# Patient Record
Sex: Female | Born: 1955 | Race: White | Hispanic: No | Marital: Married | Smoking: Never smoker
Health system: Southern US, Community
[De-identification: ages and names within clinical notes are randomized; demographics above are authoritative.]

## PROBLEM LIST (undated history)

## (undated) DIAGNOSIS — C4490 Unspecified malignant neoplasm of skin, unspecified: Secondary | ICD-10-CM

## (undated) DIAGNOSIS — B029 Zoster without complications: Secondary | ICD-10-CM

## (undated) DIAGNOSIS — C4491 Basal cell carcinoma of skin, unspecified: Secondary | ICD-10-CM

## (undated) DIAGNOSIS — E559 Vitamin D deficiency, unspecified: Secondary | ICD-10-CM

## (undated) HISTORY — DX: Vitamin D deficiency, unspecified: E55.9

## (undated) HISTORY — DX: Zoster without complications: B02.9

## (undated) HISTORY — DX: Basal cell carcinoma of skin, unspecified: C44.91

## (undated) HISTORY — PX: TONSILLECTOMY: SUR1361

## (undated) HISTORY — DX: Unspecified malignant neoplasm of skin, unspecified: C44.90

## (undated) HISTORY — PX: ABDOMINAL HYSTERECTOMY: SHX81

---

## 2015-10-23 DIAGNOSIS — C4492 Squamous cell carcinoma of skin, unspecified: Secondary | ICD-10-CM

## 2015-10-23 HISTORY — DX: Squamous cell carcinoma of skin, unspecified: C44.92

## 2017-10-23 ENCOUNTER — Ambulatory Visit: Payer: Self-pay | Admitting: Unknown Physician Specialty

## 2017-10-25 ENCOUNTER — Ambulatory Visit: Payer: BLUE CROSS/BLUE SHIELD | Admitting: Unknown Physician Specialty

## 2017-10-25 ENCOUNTER — Encounter: Payer: Self-pay | Admitting: Unknown Physician Specialty

## 2017-10-25 VITALS — BP 140/76 | HR 83 | Temp 98.8°F | Ht 67.1 in | Wt 130.5 lb

## 2017-10-25 DIAGNOSIS — B029 Zoster without complications: Secondary | ICD-10-CM | POA: Diagnosis not present

## 2017-10-25 DIAGNOSIS — R05 Cough: Secondary | ICD-10-CM | POA: Diagnosis not present

## 2017-10-25 DIAGNOSIS — Z7689 Persons encountering health services in other specified circumstances: Secondary | ICD-10-CM

## 2017-10-25 DIAGNOSIS — B028 Zoster with other complications: Secondary | ICD-10-CM | POA: Insufficient documentation

## 2017-10-25 DIAGNOSIS — J0100 Acute maxillary sinusitis, unspecified: Secondary | ICD-10-CM

## 2017-10-25 DIAGNOSIS — R059 Cough, unspecified: Secondary | ICD-10-CM

## 2017-10-25 MED ORDER — CEFDINIR 300 MG PO CAPS: 300.0000 mg | ORAL_CAPSULE | Freq: Two times a day (BID) | ORAL | 0 refills | Status: DC

## 2017-10-25 MED ORDER — BENZONATATE 200 MG PO CAPS: 200.0000 mg | ORAL_CAPSULE | Freq: Two times a day (BID) | ORAL | 0 refills | Status: DC | PRN

## 2017-10-25 MED ORDER — VALACYCLOVIR HCL 1 G PO TABS: 1000.0000 mg | ORAL_TABLET | Freq: Every day | ORAL | 3 refills | Status: DC

## 2017-10-25 NOTE — Progress Notes (Signed)
BP 140/76   Pulse 83   Temp 98.8 F (37.1 C) (Oral)   Ht 5' 7.1" (1.704 m)   Wt 130 lb 8 oz (59.2 kg)   SpO2 98%   BMI 20.38 kg/m    Subjective:    Patient ID: Heather Browning, female    DOB: Jan 01, 1956, 62 y.o.   MRN: [id number]  HPI: Heather Browning is a 62 y.o. female  Chief Complaint  Patient presents with  . Establish Care  . URI    pt states she has been feeling bad since 10/03/17. States she has had a cough, sore throat, sinus pressue, headache, and chills at night. States the cough gets worse at night    Just moved from [facility].  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    URI   This is a new (History of sinus infections.  usually requires 2 weeks of Augmentin) problem. Episode onset: 2-3 weeks. The problem has been waxing and waning. Maximum temperature: started with a fever for 3 days. Associated symptoms include congestion, coughing, ear pain, headaches, rhinorrhea and sinus pain. Pertinent negatives include no abdominal pain, chest pain, diarrhea, dysuria, joint pain, joint swelling, nausea, neck pain, plugged ear sensation, rash, sneezing, sore throat, swollen glands, vomiting or wheezing. Treatments tried: Started 5 days of Augmentin 3 weeks of antibiotics.   Past Medical History:  Diagnosis Date  . Shingles    Past Surgical History:  Procedure Laterality Date  . ABDOMINAL HYSTERECTOMY     pt has one ovary left  . TONSILLECTOMY     Social History   Socioeconomic History  . Marital status: Married    Spouse name: Not on file  . Number of children: Not on file  . Years of education: Not on file  . Highest education level: Not on file  Social Needs  . Financial resource strain: Not on file  . Food insecurity - worry: Not on file  . Food insecurity - inability: Not on file  . Transportation needs - medical: Not on file  . Transportation needs - non-medical: Not on file  Occupational History  . Not on file  Tobacco Use  . Smoking status: Never  Smoker  . Smokeless tobacco: Never Used  Substance and Sexual Activity  . Alcohol use: No    Frequency: Never  . Drug use: No  . Sexual activity: Not on file  Other Topics Concern  . Not on file  Social History Narrative  . Not on file   Family History  Problem Relation Age of Onset  . Polycythemia Mother   . Heart disease Father   . Heart disease Maternal Grandmother   . Heart disease Maternal Grandfather   . Heart disease Paternal Grandfather     Relevant past medical, surgical, family and social history reviewed and updated as indicated. Interim medical history since our last visit reviewed. Allergies and medications reviewed and updated.  Review of Systems  HENT: Positive for congestion, ear pain, rhinorrhea and sinus pain. Negative for sneezing and sore throat.   Respiratory: Positive for cough. Negative for wheezing.   Cardiovascular: Negative for chest pain.  Gastrointestinal: Negative for abdominal pain, diarrhea, nausea and vomiting.  Genitourinary: Negative for dysuria.  Musculoskeletal: Negative for joint pain and neck pain.  Skin: Negative for rash.  Neurological: Positive for headaches.    Per HPI unless specifically indicated above     Objective:    BP 140/76   Pulse 83   Temp 98.8  F (37.1 C) (Oral)   Ht 5' 7.1" (1.704 m)   Wt 130 lb 8 oz (59.2 kg)   SpO2 98%   BMI 20.38 kg/m   Wt Readings from Last 3 Encounters:  10/25/17 130 lb 8 oz (59.2 kg)    Physical Exam  Constitutional: She is oriented to person, place, and time. She appears well-developed and well-nourished. No distress.  HENT:  Head: Normocephalic and atraumatic.  Right Ear: Tympanic membrane and ear canal normal.  Left Ear: Tympanic membrane and ear canal normal.  Nose: No rhinorrhea. Right sinus exhibits maxillary sinus tenderness. Right sinus exhibits no frontal sinus tenderness. Left sinus exhibits maxillary sinus tenderness. Left sinus exhibits no frontal sinus tenderness.    Eyes: Conjunctivae and lids are normal. Right eye exhibits no discharge. Left eye exhibits no discharge. No scleral icterus.  Cardiovascular: Normal rate and regular rhythm.  Pulmonary/Chest: Effort normal and breath sounds normal. No respiratory distress.  Abdominal: Normal appearance. There is no splenomegaly or hepatomegaly.  Musculoskeletal: Normal range of motion.  Neurological: She is alert and oriented to person, place, and time.  Skin: Skin is intact. No rash noted. No pallor.  Psychiatric: She has a normal mood and affect. Her behavior is normal. Judgment and thought content normal.    No results found for this or any previous visit.    Assessment & Plan:   Problem List Items Addressed This Visit      Unprioritized   Zoster with other complications   Relevant Medications   cefdinir (OMNICEF) 300 MG capsule   valACYclovir (VALTREX) 1000 MG tablet    Other Visit Diagnoses    Acute non-recurrent maxillary sinusitis    -  Primary   Relevant Medications   cefdinir (OMNICEF) 300 MG capsule   benzonatate (TESSALON) 200 MG capsule   valACYclovir (VALTREX) 1000 MG tablet   Herpes zoster without complication       Relevant Medications   cefdinir (OMNICEF) 300 MG capsule   valACYclovir (VALTREX) 1000 MG tablet   Encounter to establish care       Cough       Tessalon Perles for cough.  Pt ed.      -+   Follow up plan: Return if symptoms worsen or fail to improve, for Fall for phyysical.

## 2017-10-25 NOTE — Patient Instructions (Addendum)
Dr. Marolyn Hammock [facility] in [place]

## 2017-12-13 ENCOUNTER — Ambulatory Visit
Admission: RE | Admit: 2017-12-13 | Discharge: 2017-12-13 | Disposition: A | Discharge status: Home / Self Care | Payer: 59 | Source: Ambulatory Visit | Attending: Unknown Physician Specialty | Admitting: Unknown Physician Specialty

## 2017-12-13 ENCOUNTER — Encounter: Payer: Self-pay | Admitting: Unknown Physician Specialty

## 2017-12-13 ENCOUNTER — Ambulatory Visit: Payer: 59 | Admitting: Unknown Physician Specialty

## 2017-12-13 VITALS — BP 125/77 | HR 68 | Temp 98.3°F | Wt 129.0 lb

## 2017-12-13 DIAGNOSIS — R059 Cough, unspecified: Secondary | ICD-10-CM

## 2017-12-13 DIAGNOSIS — R05 Cough: Secondary | ICD-10-CM

## 2017-12-13 DIAGNOSIS — R5383 Other fatigue: Secondary | ICD-10-CM | POA: Diagnosis not present

## 2017-12-13 DIAGNOSIS — J320 Chronic maxillary sinusitis: Secondary | ICD-10-CM | POA: Diagnosis not present

## 2017-12-13 DIAGNOSIS — K1379 Other lesions of oral mucosa: Secondary | ICD-10-CM

## 2017-12-13 MED ORDER — DOXYCYCLINE HYCLATE 100 MG PO TABS: 100.0000 mg | ORAL_TABLET | Freq: Two times a day (BID) | ORAL | 0 refills | Status: DC

## 2017-12-13 NOTE — Progress Notes (Signed)
BP 125/77   Pulse 68   Temp 98.3 F (36.8 C) (Oral)   Wt 129 lb (58.5 kg)   SpO2 99%   BMI 20.14 kg/m    Subjective:    Patient ID: Heather Browning, female    DOB: [birth year], 62 y.o.   MRN: [id number]  HPI: Heather Browning is a 62 y.o. female  Chief Complaint  Patient presents with  . Cough    pt states she still has a wheezy cough, has had it since January visit. Has also had a almost constant headache since the cough started as well.   . Fatigue    pt states she has been feeling very fatigued and tired since January as well, but worse within the last week    Pt is here with ongoing symptoms for 2 months.  I saw her last month and prescribed Omnicef.  Following this, she thought she was improving, never totally resolved and started getting worse again.    Cough  This is a recurrent problem. The problem has been gradually worsening. The cough is productive of purulent sputum. Associated symptoms include chest pain, ear congestion, ear pain, headaches, nasal congestion, postnasal drip and shortness of breath. Pertinent negatives include no chills, fever, heartburn, hemoptysis, myalgias, rash, rhinorrhea, sore throat, sweats, weight loss or wheezing. The symptoms are aggravated by lying down and cold air (warm liquids and cough drops help). Treatments tried: antibiotics.   Mouth sores are recurrent.  On Valtrex.  Having to increase dose  Relevant past medical, surgical, family and social history reviewed and updated as indicated. Interim medical history since our last visit reviewed. Allergies and medications reviewed and updated.  Review of Systems  Constitutional: Positive for fatigue. Negative for chills, fever and weight loss.       Fatigue is a significant complaint that seems to be getting worse.  Sleeps 8-9 hours at night and napping.    HENT: Positive for ear pain and postnasal drip. Negative for rhinorrhea and sore throat.   Respiratory: Positive for cough and shortness of  breath. Negative for hemoptysis and wheezing.   Cardiovascular: Positive for chest pain.  Gastrointestinal: Negative for heartburn.  Musculoskeletal: Negative for myalgias.  Skin: Negative for rash.  Neurological: Positive for headaches.    Per HPI unless specifically indicated above     Objective:    BP 125/77   Pulse 68   Temp 98.3 F (36.8 C) (Oral)   Wt 129 lb (58.5 kg)   SpO2 99%   BMI 20.14 kg/m   Wt Readings from Last 3 Encounters:  12/13/17 129 lb (58.5 kg)  10/25/17 130 lb 8 oz (59.2 kg)    Physical Exam  Constitutional: She is oriented to person, place, and time. She appears well-developed and well-nourished. No distress.  HENT:  Head: Normocephalic and atraumatic.  Right Ear: Tympanic membrane and ear canal normal.  Left Ear: Tympanic membrane and ear canal normal.  Nose: No rhinorrhea. Right sinus exhibits maxillary sinus tenderness. Right sinus exhibits no frontal sinus tenderness. Left sinus exhibits maxillary sinus tenderness. Left sinus exhibits no frontal sinus tenderness.  Eyes: Conjunctivae and lids are normal. Right eye exhibits no discharge. Left eye exhibits no discharge. No scleral icterus.  Cardiovascular: Normal rate and regular rhythm.  Pulmonary/Chest: Effort normal and breath sounds normal. No respiratory distress.  Abdominal: Normal appearance. There is no splenomegaly or hepatomegaly.  Musculoskeletal: Normal range of motion.  Neurological: She is alert and oriented to person, place, and time.  Skin: Skin is intact. No rash noted. No pallor.  Psychiatric: She has a normal mood and affect. Her behavior is normal. Judgment and thought content normal.    No results found for this or any previous visit.    Assessment & Plan:   Problem List Items Addressed This Visit    None    Visit Diagnoses    Chronic maxillary sinusitis    -  Primary   Refer to ENT due to persistent symptoms despite adequate treatment.  Rx for Doxycycline to r/o atypical    Relevant Medications   doxycycline (VIBRA-TABS) 100 MG tablet   Other Relevant Orders   Ambulatory referral to ENT   Cough       Relevant Orders   DG Chest 2 View   Fatigue, unspecified type       Chec TSH, chest x-ray, CBC, CMP   Relevant Orders   CBC with Differential/Platelet   Comprehensive metabolic panel   VITAMIN D 25 Hydroxy (Vit-D Deficiency, Fractures)   TSH   Mouth sores       Worsening.  Increasing Valtrex   Relevant Orders   CBC with Differential/Platelet   Vitamin B12       Follow up plan: Return in about 1 week (around 12/20/2017).

## 2017-12-14 LAB — COMPREHENSIVE METABOLIC PANEL
A/G RATIO: 2 (ref 1.2–2.2)
ALK PHOS: 61 IU/L (ref 39–117)
ALT: 11 IU/L (ref 0–32)
AST: 16 IU/L (ref 0–40)
Albumin: 4.3 g/dL (ref 3.5–5.5)
BUN/Creatinine Ratio: 16 (ref 9–23)
BUN: 12 mg/dL (ref 6–24)
Bilirubin Total: 0.5 mg/dL (ref 0.0–1.2)
CO2: 23 mmol/L (ref 20–29)
Calcium: 9.4 mg/dL (ref 8.7–10.2)
Chloride: 101 mmol/L (ref 96–106)
Creatinine, Ser: 0.74 mg/dL (ref 0.57–1.00)
GFR calc Af Amer: 103 mL/min/{1.73_m2} (ref >59)
GFR calc non Af Amer: 89 mL/min/{1.73_m2} (ref >59)
GLOBULIN, TOTAL: 2.1 g/dL (ref 1.5–4.5)
Glucose: 69 mg/dL (ref 65–99)
Potassium: 4.2 mmol/L (ref 3.5–5.2)
SODIUM: 142 mmol/L (ref 134–144)
Total Protein: 6.4 g/dL (ref 6.0–8.5)

## 2017-12-14 LAB — CBC WITH DIFFERENTIAL/PLATELET
BASOS: 1 %
Basophils Absolute: 0.1 10*3/uL (ref 0.0–0.2)
EOS (ABSOLUTE): 0.1 10*3/uL (ref 0.0–0.4)
EOS: 2 %
HEMATOCRIT: 41.7 % (ref 34.0–46.6)
Hemoglobin: 13.4 g/dL (ref 11.1–15.9)
Immature Grans (Abs): 0 10*3/uL (ref 0.0–0.1)
Immature Granulocytes: 0 %
Lymphocytes Absolute: 1.5 10*3/uL (ref 0.7–3.1)
Lymphs: 26 %
MCH: 33.2 pg — ABNORMAL HIGH (ref 26.6–33.0)
MCHC: 32.1 g/dL (ref 31.5–35.7)
MCV: 103 fL — AB (ref 79–97)
MONOS ABS: 0.5 10*3/uL (ref 0.1–0.9)
Monocytes: 9 %
Neutrophils Absolute: 3.5 10*3/uL (ref 1.4–7.0)
Neutrophils: 62 %
Platelets: 264 10*3/uL (ref 150–379)
RBC: 4.04 x10E6/uL (ref 3.77–5.28)
RDW: 14 % (ref 12.3–15.4)
WBC: 5.7 10*3/uL (ref 3.4–10.8)

## 2017-12-14 LAB — TSH: TSH: 1.99 u[IU]/mL (ref 0.450–4.500)

## 2017-12-14 LAB — VITAMIN B12: VITAMIN B 12: 385 pg/mL (ref 232–1245)

## 2017-12-14 LAB — VITAMIN D 25 HYDROXY (VIT D DEFICIENCY, FRACTURES): VIT D 25 HYDROXY: 15.8 ng/mL — AB (ref 30.0–100.0)

## 2017-12-24 ENCOUNTER — Ambulatory Visit: Payer: 59 | Admitting: Unknown Physician Specialty

## 2017-12-25 ENCOUNTER — Telehealth: Payer: Self-pay | Admitting: Unknown Physician Specialty

## 2017-12-25 ENCOUNTER — Encounter: Payer: Self-pay | Admitting: Unknown Physician Specialty

## 2017-12-25 DIAGNOSIS — D7589 Other specified diseases of blood and blood-forming organs: Secondary | ICD-10-CM | POA: Insufficient documentation

## 2017-12-25 DIAGNOSIS — E559 Vitamin D deficiency, unspecified: Secondary | ICD-10-CM

## 2017-12-25 HISTORY — DX: Vitamin D deficiency, unspecified: E55.9

## 2017-12-25 MED ORDER — VITAMIN D (ERGOCALCIFEROL) 1.25 MG (50000 UNIT) PO CAPS: 50000.0000 [IU] | ORAL_CAPSULE | ORAL | 0 refills | Status: DC

## 2017-12-25 NOTE — Progress Notes (Signed)
Patient notified of results by phone.

## 2017-12-25 NOTE — Telephone Encounter (Signed)
Discussed labs with pt.  Prescription doses of Vitamin D and oral B12

## 2018-01-06 ENCOUNTER — Ambulatory Visit: Payer: 59 | Admitting: Unknown Physician Specialty

## 2018-01-06 ENCOUNTER — Encounter: Payer: Self-pay | Admitting: Unknown Physician Specialty

## 2018-01-06 ENCOUNTER — Telehealth: Payer: Self-pay | Admitting: Unknown Physician Specialty

## 2018-01-06 VITALS — BP 129/84 | HR 96 | Temp 98.4°F | Ht 67.1 in | Wt 135.6 lb

## 2018-01-06 DIAGNOSIS — R5383 Other fatigue: Secondary | ICD-10-CM | POA: Diagnosis not present

## 2018-01-06 DIAGNOSIS — R053 Chronic cough: Secondary | ICD-10-CM | POA: Insufficient documentation

## 2018-01-06 DIAGNOSIS — R059 Cough, unspecified: Secondary | ICD-10-CM

## 2018-01-06 DIAGNOSIS — R05 Cough: Secondary | ICD-10-CM | POA: Diagnosis not present

## 2018-01-06 DIAGNOSIS — D7589 Other specified diseases of blood and blood-forming organs: Secondary | ICD-10-CM

## 2018-01-06 MED ORDER — FLUTICASONE-SALMETEROL 100-50 MCG/DOSE IN AEPB: 1.0000 | INHALATION_SPRAY | Freq: Two times a day (BID) | RESPIRATORY_TRACT | 3 refills | Status: DC

## 2018-01-06 NOTE — Progress Notes (Signed)
BP 129/84   Pulse 96   Temp 98.4 F (36.9 C) (Oral)   Ht 5' 7.1" (1.704 m)   Wt 135 lb 9.6 oz (61.5 kg)   SpO2 99%   BMI 21.17 kg/m    Subjective:    Patient ID: Heather Browning, female    DOB: 11-Dec-1955, 62 y.o.   MRN: [id number]  HPI: Heather Browning is a 62 y.o. female  Chief Complaint  Patient presents with  . Follow-up    pt states she is not feeling any better after starting vitamin D and B12   Cough Pt is here with complaints of a persistent cough.  She is concerned about family mambers who have fibrocystic lung disease.  Chest x-ray was normal.  Continues to be fatigued.  She does complain of a post nasal drip  Macrocytosis Pt with Macrocytosis without anemia.  No alcohol use.  B12 is low normal  Relevant past medical, surgical, family and social history reviewed and updated as indicated. Interim medical history since our last visit reviewed. Allergies and medications reviewed and updated.  Review of Systems  Per HPI unless specifically indicated above     Objective:    BP 129/84   Pulse 96   Temp 98.4 F (36.9 C) (Oral)   Ht 5' 7.1" (1.704 m)   Wt 135 lb 9.6 oz (61.5 kg)   SpO2 99%   BMI 21.17 kg/m   Wt Readings from Last 3 Encounters:  01/06/18 135 lb 9.6 oz (61.5 kg)  12/13/17 129 lb (58.5 kg)  10/25/17 130 lb 8 oz (59.2 kg)    Physical Exam  Constitutional: She is oriented to person, place, and time. She appears well-developed and well-nourished. No distress.  HENT:  Head: Normocephalic and atraumatic.  Eyes: Conjunctivae and lids are normal. Right eye exhibits no discharge. Left eye exhibits no discharge. No scleral icterus.  Neck: Normal range of motion. Neck supple. No JVD present. Carotid bruit is not present.  Cardiovascular: Normal rate, regular rhythm and normal heart sounds.  Pulmonary/Chest: Effort normal and breath sounds normal.  Abdominal: Normal appearance. There is no splenomegaly or hepatomegaly.  Musculoskeletal: Normal range of  motion.  Neurological: She is alert and oriented to person, place, and time.  Skin: Skin is warm, dry and intact. No rash noted. No pallor.  Psychiatric: She has a normal mood and affect. Her behavior is normal. Judgment and thought content normal.   Spirometry is normal with advanced lung age.  Cough not much improved.    Results for orders placed or performed in visit on 12/13/17  CBC with Differential/Platelet  Result Value Ref Range   WBC 5.7 3.4 - 10.8 x10E3/uL   RBC 4.04 3.77 - 5.28 x10E6/uL   Hemoglobin 13.4 11.1 - 15.9 g/dL   Hematocrit 41.7 34.0 - 46.6 %   MCV 103 (H) 79 - 97 fL   MCH 33.2 (H) 26.6 - 33.0 pg   MCHC 32.1 31.5 - 35.7 g/dL   RDW 14.0 12.3 - 15.4 %   Platelets 264 150 - 379 x10E3/uL   Neutrophils 62 Not Estab. %   Lymphs 26 Not Estab. %   Monocytes 9 Not Estab. %   Eos 2 Not Estab. %   Basos 1 Not Estab. %   Neutrophils Absolute 3.5 1.4 - 7.0 x10E3/uL   Lymphocytes Absolute 1.5 0.7 - 3.1 x10E3/uL   Monocytes Absolute 0.5 0.1 - 0.9 x10E3/uL   EOS (ABSOLUTE) 0.1 0.0 - 0.4 x10E3/uL  Basophils Absolute 0.1 0.0 - 0.2 x10E3/uL   Immature Granulocytes 0 Not Estab. %   Immature Grans (Abs) 0.0 0.0 - 0.1 x10E3/uL  Comprehensive metabolic panel  Result Value Ref Range   Glucose 69 65 - 99 mg/dL   BUN 12 6 - 24 mg/dL   Creatinine, Ser 0.74 0.57 - 1.00 mg/dL   GFR calc non Af Amer 89 >59 mL/min/1.73   GFR calc Af Amer 103 >59 mL/min/1.73   BUN/Creatinine Ratio 16 9 - 23   Sodium 142 134 - 144 mmol/L   Potassium 4.2 3.5 - 5.2 mmol/L   Chloride 101 96 - 106 mmol/L   CO2 23 20 - 29 mmol/L   Calcium 9.4 8.7 - 10.2 mg/dL   Total Protein 6.4 6.0 - 8.5 g/dL   Albumin 4.3 3.5 - 5.5 g/dL   Globulin, Total 2.1 1.5 - 4.5 g/dL   Albumin/Globulin Ratio 2.0 1.2 - 2.2   Bilirubin Total 0.5 0.0 - 1.2 mg/dL   Alkaline Phosphatase 61 39 - 117 IU/L   AST 16 0 - 40 IU/L   ALT 11 0 - 32 IU/L  VITAMIN D 25 Hydroxy (Vit-D Deficiency, Fractures)  Result Value Ref Range   Vit  D, 25-Hydroxy 15.8 (L) 30.0 - 100.0 ng/mL  TSH  Result Value Ref Range   TSH 1.990 0.450 - 4.500 uIU/mL  Vitamin B12  Result Value Ref Range   Vitamin B-12 385 232 - 1,245 pg/mL      Assessment & Plan:   Problem List Items Addressed This Visit      Unprioritized   Chronic cough    Family history of pulmonary fibrosis and pt is concerned.  Will refer to pulmonary.  Start Advair inhaler to see if improved      Macrocytosis    Recheck labs today.  Due to fatigue, refer to hematology if continued macrocytic changes      Relevant Orders   CBC with Differential/Platelet    Other Visit Diagnoses    Cough    -  Primary   Relevant Orders   Spirometry with Graph (Completed)   Ambulatory referral to Pulmonology   Fatigue, unspecified type       Relevant Orders   Spirometry with Graph (Completed)   CBC with Differential/Platelet   Sed Rate (ESR)   ANA w/Reflex   Alpha Gal IgE   Tissue Transglutaminase Abs,IgG,IgA   Ambulatory referral to Pulmonology       Follow up plan: Return in about 4 weeks (around 02/03/2018).

## 2018-01-06 NOTE — Telephone Encounter (Signed)
Called and left patient a vm message.

## 2018-01-06 NOTE — Telephone Encounter (Signed)
Patient unsure if she needed to come back for a 4 week fu or not. Informed patient that the instructions said to return in 4 weeks. Patient stated that provider informed her that since she is going to see a pulmonologist she wont have to return. Patient would like to be sure from provider is she needs to return in 4 weeks or not for appt.   Please Advise.  Thank you

## 2018-01-06 NOTE — Telephone Encounter (Signed)
OK not to come back

## 2018-01-06 NOTE — Telephone Encounter (Signed)
Routing to provider. When did you want the patient to come back to see you?

## 2018-01-06 NOTE — Telephone Encounter (Signed)
Tiffany, please see Cheryl's note below and adjust appointment as necessary.  Thanks.

## 2018-01-06 NOTE — Assessment & Plan Note (Addendum)
Family history of pulmonary fibrosis and pt is concerned.  Will refer to pulmonary.  Start Advair inhaler to see if improved

## 2018-01-06 NOTE — Assessment & Plan Note (Signed)
Recheck labs today.  Due to fatigue, refer to hematology if continued macrocytic changes

## 2018-01-07 ENCOUNTER — Telehealth: Payer: Self-pay | Admitting: Unknown Physician Specialty

## 2018-01-07 MED ORDER — BENZONATATE 200 MG PO CAPS: 200.0000 mg | ORAL_CAPSULE | Freq: Two times a day (BID) | ORAL | 0 refills | Status: DC | PRN

## 2018-01-07 NOTE — Telephone Encounter (Signed)
Called and spoke to patient. I let her know what Malachy Mood said. Referral was sent over to [facility] this morning so I will call over to them to see if the patient can be seen soon.

## 2018-01-07 NOTE — Telephone Encounter (Signed)
Called over to [facility]. I was told by the receptionist that the person who does referrals was not in today. I explained that we were wanting the patient to be seen sooner rather than later. She took my name and direct phone number to leave a message for their referral coordinator. Will wait for a call back in the morning and will call them back if I do not receive a call back by lunch time tomorrow.

## 2018-01-07 NOTE — Telephone Encounter (Signed)
Pt. Called to report since her office visit yesterday and her nebulizer treatment, she is coughing more and more intense - non-productive. It kept her awake most of the night. Is this to be expected after the nebulizer? Also concerned will the Advair cause the same thing. Please advise pt.

## 2018-01-07 NOTE — Telephone Encounter (Signed)
No, not an expected side effect of the nebulizer.  Can we change her referral to pulmonary to ASAP?  Location is not that important.  Lets give her some Tessalon Perles to see if we can calm down the cough.

## 2018-01-07 NOTE — Telephone Encounter (Signed)
Routing to provider to advise.  

## 2018-01-08 ENCOUNTER — Other Ambulatory Visit: Payer: Self-pay | Admitting: Unknown Physician Specialty

## 2018-01-08 ENCOUNTER — Encounter: Payer: Self-pay | Admitting: Unknown Physician Specialty

## 2018-01-08 DIAGNOSIS — D7589 Other specified diseases of blood and blood-forming organs: Secondary | ICD-10-CM

## 2018-01-08 NOTE — Telephone Encounter (Signed)
Called and spoke to [place], the referral coordinator with [facility]. Explained to her what was going on with the patient and scheduled the patient an appointment for Friday 01/10/18 at 9:15 am. Will call patient and let her know.

## 2018-01-08 NOTE — Telephone Encounter (Signed)
Called and advised patient of appointment. Also provided patient with the address and phone number to [facility].

## 2018-01-09 ENCOUNTER — Telehealth: Payer: Self-pay | Admitting: Hematology and Oncology

## 2018-01-09 LAB — CBC WITH DIFFERENTIAL/PLATELET
BASOS: 1 %
Basophils Absolute: 0 10*3/uL (ref 0.0–0.2)
EOS (ABSOLUTE): 0.1 10*3/uL (ref 0.0–0.4)
EOS: 2 %
HEMATOCRIT: 37.4 % (ref 34.0–46.6)
Hemoglobin: 12.2 g/dL (ref 11.1–15.9)
IMMATURE GRANS (ABS): 0 10*3/uL (ref 0.0–0.1)
IMMATURE GRANULOCYTES: 0 %
LYMPHS: 39 %
Lymphocytes Absolute: 2.1 10*3/uL (ref 0.7–3.1)
MCH: 33.4 pg — ABNORMAL HIGH (ref 26.6–33.0)
MCHC: 32.6 g/dL (ref 31.5–35.7)
MCV: 103 fL — AB (ref 79–97)
Monocytes Absolute: 0.6 10*3/uL (ref 0.1–0.9)
Monocytes: 11 %
NEUTROS PCT: 47 %
Neutrophils Absolute: 2.7 10*3/uL (ref 1.4–7.0)
Platelets: 256 10*3/uL (ref 150–379)
RBC: 3.65 x10E6/uL — ABNORMAL LOW (ref 3.77–5.28)
RDW: 13.7 % (ref 12.3–15.4)
WBC: 5.5 10*3/uL (ref 3.4–10.8)

## 2018-01-09 LAB — SEDIMENTATION RATE: Sed Rate: 2 mm/hr (ref 0–40)

## 2018-01-09 LAB — TISSUE TRANSGLUTAMINASE ABS,IGG,IGA: Tissue Transglut Ab: <2 U/mL (ref 0–5)

## 2018-01-09 LAB — ALPHA GAL IGE

## 2018-01-09 LAB — ANA W/REFLEX: Anti Nuclear Antibody(ANA): NEGATIVE

## 2018-01-09 NOTE — Telephone Encounter (Signed)
Consult for MACROCYTOSIS. Ref by Kathrine Haddock M.D. Notes in Epic.  New patient pkt left at Registration for patient to complete upon arrival.  Appt conf with husband. Appt ltr also mailed. MF

## 2018-01-10 ENCOUNTER — Encounter: Payer: 59 | Admitting: Hematology and Oncology

## 2018-01-14 ENCOUNTER — Telehealth: Payer: Self-pay

## 2018-01-15 ENCOUNTER — Other Ambulatory Visit: Payer: Self-pay | Admitting: Unknown Physician Specialty

## 2018-01-15 NOTE — Telephone Encounter (Signed)
Tessalon Perles request for refill  LOV 01/06/18 with Hanson Pender, [place]

## 2018-01-16 ENCOUNTER — Inpatient Hospital Stay: Payer: 59 | Attending: Hematology and Oncology | Admitting: Hematology and Oncology

## 2018-01-16 ENCOUNTER — Encounter (INDEPENDENT_AMBULATORY_CARE_PROVIDER_SITE_OTHER): Payer: Self-pay

## 2018-01-16 ENCOUNTER — Inpatient Hospital Stay: Payer: 59

## 2018-01-16 ENCOUNTER — Other Ambulatory Visit: Payer: Self-pay

## 2018-01-16 ENCOUNTER — Encounter: Payer: Self-pay | Admitting: Hematology and Oncology

## 2018-01-16 VITALS — BP 128/75 | HR 90 | Temp 98.3°F | Resp 18 | Wt 132.6 lb

## 2018-01-16 DIAGNOSIS — D7589 Other specified diseases of blood and blood-forming organs: Secondary | ICD-10-CM

## 2018-01-16 DIAGNOSIS — R5383 Other fatigue: Secondary | ICD-10-CM | POA: Diagnosis not present

## 2018-01-16 DIAGNOSIS — R51 Headache: Secondary | ICD-10-CM | POA: Insufficient documentation

## 2018-01-16 DIAGNOSIS — E538 Deficiency of other specified B group vitamins: Secondary | ICD-10-CM

## 2018-01-16 LAB — CBC WITH DIFFERENTIAL/PLATELET
BASOS ABS: 0 10*3/uL (ref 0–0.1)
BASOS PCT: 1 %
EOS ABS: 0 10*3/uL (ref 0–0.7)
EOS PCT: 0 %
HCT: 38.7 % (ref 35.0–47.0)
HEMOGLOBIN: 13.1 g/dL (ref 12.0–16.0)
LYMPHS ABS: 0.8 10*3/uL — AB (ref 1.0–3.6)
Lymphocytes Relative: 13 %
MCH: 35 pg — AB (ref 26.0–34.0)
MCHC: 33.9 g/dL (ref 32.0–36.0)
MCV: 103.2 fL — ABNORMAL HIGH (ref 80.0–100.0)
Monocytes Absolute: 0.5 10*3/uL (ref 0.2–0.9)
Monocytes Relative: 8 %
NEUTROS PCT: 78 %
Neutro Abs: 5 10*3/uL (ref 1.4–6.5)
PLATELETS: 284 10*3/uL (ref 150–440)
RBC: 3.75 MIL/uL — AB (ref 3.80–5.20)
RDW: 13.8 % (ref 11.5–14.5)
WBC: 6.4 10*3/uL (ref 3.6–11.0)

## 2018-01-16 LAB — RETICULOCYTES
RBC.: 3.86 MIL/uL (ref 3.80–5.20)
RETIC COUNT ABSOLUTE: 65.6 10*3/uL (ref 19.0–183.0)
RETIC CT PCT: 1.7 % (ref 0.4–3.1)

## 2018-01-16 LAB — T4, FREE: Free T4: 0.96 ng/dL (ref 0.61–1.12)

## 2018-01-16 LAB — FOLATE: Folate: 53.1 ng/mL (ref >5.9)

## 2018-01-16 LAB — PATHOLOGIST SMEAR REVIEW

## 2018-01-16 NOTE — Progress Notes (Signed)
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  HPI:  The patient presented to PCP with chronic cough. She was treated with 2 different courses of antibiotics. She was sent for a chest radiograph. Despite antibiotic courses, patient continued to experience marked fatigue.   CBC on 12/13/2017 revealed a hematocrit of 41.7, hemoglobin 13.4, MCV 103, platelets 264,000, WBC 5700 with an ANC of 3500.  Differential was unremarkable.  CMP was normal.  TSH was 1.99.  B12 was 385.    Patient was seen in consult by Dr. Wallene Huh on 01/10/2018.  Patient was started on Drisdol 50,000 units (vitamin D2) and over the counter B12 supplements approximately 4 weeks ago.   Patient eats well. She eats meat and vegetables on a regular basis.  She craves sugar. She denies ice pica and restless leg symptoms.   Patient has post nasal drip with cough "up and down" since 10/08/2017. She experiences stress incontinence related to her cough. Patient experiences frequent headaches that she attributes to her sinus symptoms.   Mammogram is up to date. Last colonoscopy was "about 9 years ago". Patient denies any new medications. She took a short course of fish oil capsules. She denies other herbal products. She uses a "womens gummy vitamin".   Patient's mother has polycythemia rubra vera.  She is treated with Anagrelide.    Past Medical History:  Diagnosis Date  . Shingles   . Skin cancer   . Vitamin D deficiency 12/25/2017    Past Surgical History:  Procedure Laterality Date  . ABDOMINAL HYSTERECTOMY     pt has one ovary left  . TONSILLECTOMY      Family History  Problem Relation Age of Onset  . Polycythemia Mother   . Heart disease Father   . Heart disease Maternal Grandmother   . Heart disease Maternal Grandfather   .  Heart disease Paternal Grandfather     Social History:  reports that she has never smoked. She has never used smokeless tobacco. She reports that she does not drink alcohol or use drugs.  Patient uses very little alcohol. She has never smoked. Patient is originally from [place], [place]. She lives in [place], [place] with her husband. Patient has 3 adult children. 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 The patient is accompanied by husband, Conley Simmonds, today.  Allergies:  Allergies  Allergen Reactions  . Macrobid [Nitrofurantoin Macrocrystal] Hives  . Morphine And Related Hives    Current Medications: Current Outpatient Medications  Medication Sig Dispense Refill  . benzonatate (TESSALON) 200 MG capsule TAKE 1 CAPSULE BY MOUTH TWICE DAILY AS NEEDED FOR COUGH 20 capsule 0  . cetirizine (ZYRTEC) 10 MG tablet Take 10 mg by mouth daily.    . fluticasone (FLONASE) 50 MCG/ACT nasal spray Place into both nostrils daily.    . Fluticasone-Salmeterol (ADVAIR) 100-50 MCG/DOSE AEPB Inhale 1 puff into the lungs 2 (two) times daily. 1 each 3  . Multiple Vitamins-Minerals (MULTIVITAMIN GUMMIES WOMENS PO) Take by mouth daily.    . predniSONE (DELTASONE) 10 MG tablet Prednisone 10 mg, 4 pills q day x 2 days, 3 pills q day x 2 days, 2 pills q day x 2 days, 1 pill q day x 2 days.    Marland Kitchen  valACYclovir (VALTREX) 1000 MG tablet Take 1 tablet (1,000 mg total) by mouth daily. 90 tablet 3  . vitamin B-12 (CYANOCOBALAMIN) 500 MCG tablet Take 1,000 mcg by mouth daily.    . Vitamin D, Ergocalciferol, (DRISDOL) 50000 units CAPS capsule Take 1 capsule (50,000 Units total) by mouth every 7 (seven) days. 12 capsule 0   No current facility-administered medications for this visit.     Review of Systems:  GENERAL:  Fatigue.  No fevers, sweats or weight loss. PERFORMANCE STATUS (ECOG):  1 HEENT:  Rhinorrhea and post nasal drip. No visual changes, sore  throat, mouth sores or tenderness. Lungs: Cough. No shortness of breath.  No hemoptysis. Cardiac:  No chest pain, palpitations, orthopnea, or PND. GI:  No nausea, vomiting, diarrhea, constipation, melena or hematochezia. GU:  No urgency, frequency, dysuria, or hematuria. Musculoskeletal:  Interval swollen hands from "over working".  No back pain.  No joint pain.  No muscle tenderness. Extremities:  No pain or swelling. Skin:  Areas of hypopigmentation to upper anterior chest wall and base of LEFT neck. No rashes other skin changes. Neuro:  Sinus headache.  No numbness or weakness, balance or coordination issues. Endocrine:  No diabetes, thyroid issues, hot flashes or night sweats. Psych:  No mood changes, depression or anxiety. Pain:  No focal pain. Review of systems:  All other systems reviewed and found to be negative.  Physical Exam: Blood pressure 128/75, pulse 90, temperature 98.3 F (36.8 C), temperature source Tympanic, resp. rate 18, weight 132 lb 9 oz (60.1 kg), SpO2 98 %. GENERAL:  Well developed, well nourished, woman sitting comfortably in the exam room in no acute distress. MENTAL STATUS:  Alert and oriented to person, place and time. HEAD:  Shoulder length blonde hair.  Normocephalic, atraumatic, face symmetric, no Cushingoid features. EYES:  Glasses.  Blue eyes.  Pupils equal round and reactive to light and accomodation.  No conjunctivitis or scleral icterus. ENT:  Oropharynx clear without lesion.  Tongue normal. Mucous membranes moist.  RESPIRATORY:  Clear to auscultation without rales, wheezes or rhonchi. CARDIOVASCULAR:  Regular rate and rhythm without murmur, rub or gallop. ABDOMEN:  Soft, non-tender, with active bowel sounds, and no hepatosplenomegaly.  No masses. SKIN:  No rashes, ulcers or lesions. EXTREMITIES: No edema, no skin discoloration or tenderness.  No palpable cords. LYMPH NODES: No palpable cervical, supraclavicular, axillary or inguinal adenopathy   NEUROLOGICAL: Unremarkable. PSYCH:  Appropriate.   No visits with results within 3 Day(s) from this visit.  Latest known visit with results is:  Office Visit on 01/06/2018  Component Date Value Ref Range Status  . WBC 01/06/2018 5.5  3.4 - 10.8 x10E3/uL Final  . RBC 01/06/2018 3.65* 3.77 - 5.28 x10E6/uL Final  . Hemoglobin 01/06/2018 12.2  11.1 - 15.9 g/dL Final  . Hematocrit 01/06/2018 37.4  34.0 - 46.6 % Final  . MCV 01/06/2018 103* 79 - 97 fL Final  . MCH 01/06/2018 33.4* 26.6 - 33.0 pg Final  . MCHC 01/06/2018 32.6  31.5 - 35.7 g/dL Final  . RDW 01/06/2018 13.7  12.3 - 15.4 % Final  . Platelets 01/06/2018 256  150 - 379 x10E3/uL Final  . Neutrophils 01/06/2018 47  Not Estab. % Final  . Lymphs 01/06/2018 39  Not Estab. % Final  . Monocytes 01/06/2018 11  Not Estab. % Final  . Eos 01/06/2018 2  Not Estab. % Final  . Basos 01/06/2018 1  Not Estab. % Final  . Neutrophils Absolute 01/06/2018 2.7  1.4 - 7.0 x10E3/uL Final  . Lymphocytes Absolute 01/06/2018 2.1  0.7 - 3.1 x10E3/uL Final  . Monocytes Absolute 01/06/2018 0.6  0.1 - 0.9 x10E3/uL Final  . EOS (ABSOLUTE) 01/06/2018 0.1  0.0 - 0.4 x10E3/uL Final  . Basophils Absolute 01/06/2018 0.0  0.0 - 0.2 x10E3/uL Final  . Immature Granulocytes 01/06/2018 0  Not Estab. % Final  . Immature Grans (Abs) 01/06/2018 0.0  0.0 - 0.1 x10E3/uL Final  . Sed Rate 01/06/2018 2  0 - 40 mm/hr Final  . Anit Nuclear Antibody(ANA) 01/06/2018 Negative  Negative Final  . Alpha Gal IgE* 01/06/2018 <0.10  <0.10 kU/L Final   Comment: Previous reports (JACI [id number]) have demonstrated that patients with IgE antibodies to galactose-a-1,3-galactose are at risk for delayed anaphylaxis, angioedema, or urticaria following consumption of beef, pork, or lamb. *This test was developed and its performance characteristics determined by [employer]. It has not been cleared or approved by the U.S. Food and Drug Administration.   . Transglutaminase  IgA 01/06/2018 <2  0 - 3 U/mL Final   Comment:                               Negative        0 -  3                               Weak Positive   4 - 10                               Positive           >10  Tissue Transglutaminase (tTG) has been identified  as the endomysial antigen.  Studies have demonstr-  ated that endomysial IgA antibodies have over 99%  specificity for gluten sensitive enteropathy.   . 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  Diet is good.  She denies liver disease.  She has been on oral B12 x 4 weeks.  CBC on 12/13/2017 revealed a hematocrit of 41.7, hemoglobin 13.4, MCV 103, platelets 264,000, WBC 5700 with an ANC of 3500.  Differential was unremarkable.  TSH was normal.  B12 was 385.    Her mother has polycythemia rubra vera (PV).  Symptomatically, she notes fatigue.  Exam is unremarkable.  Plan: 1.  Discuss macrocytosis. Differential include liver disease, vitamin deficiencies, hypothyroidism, and underlying MDS. 2.  Labs today:  CBC with diff, retic, folate, MMA., free T4, copper level. 3.  Peripheral smear for path review. 4.  Patient to obtain records (prior CBCs) from [place], [place]. 5.  RTC in 1 week for MD assessment and review of workup.    Honor Loh, NP  01/16/2018, 2:52 PM   I saw and evaluated the patient, participating in the key portions of the service and reviewing pertinent diagnostic studies and records.  I reviewed the nurse practitioner's note and agree with the findings and the plan.  The assessment and plan were discussed with the patient.  Several questions were asked by the patient and answered.   Nolon Stalls, MD 01/16/2018,2:52 PM

## 2018-01-16 NOTE — Progress Notes (Signed)
Pt in today for new patient visit with husband. Recently relocated to [facility] from [place], [place] for husband"s job.  Pt reports being very fatigued for several months.  Reports having a cough since December 2018.

## 2018-01-18 DIAGNOSIS — E538 Deficiency of other specified B group vitamins: Secondary | ICD-10-CM | POA: Insufficient documentation

## 2018-01-18 LAB — COPPER, SERUM: Copper: 84 ug/dL (ref 72–166)

## 2018-01-18 LAB — METHYLMALONIC ACID, SERUM: METHYLMALONIC ACID, QUANTITATIVE: 162 nmol/L (ref 0–378)

## 2018-01-20 NOTE — Addendum Note (Signed)
Encounter addended by: Helene Kelp, RT on: 01/20/2018 11:19 AM  Actions taken: Imaging Exam begun

## 2018-01-23 ENCOUNTER — Encounter: Payer: Self-pay | Admitting: Hematology and Oncology

## 2018-01-23 ENCOUNTER — Ambulatory Visit: Payer: 59

## 2018-01-23 ENCOUNTER — Telehealth: Payer: Self-pay | Admitting: Hematology and Oncology

## 2018-01-23 ENCOUNTER — Inpatient Hospital Stay: Payer: 59 | Attending: Hematology and Oncology | Admitting: Hematology and Oncology

## 2018-01-23 ENCOUNTER — Other Ambulatory Visit: Payer: Self-pay

## 2018-01-23 VITALS — BP 109/71 | HR 82 | Temp 97.0°F | Resp 18 | Wt 132.5 lb

## 2018-01-23 DIAGNOSIS — E559 Vitamin D deficiency, unspecified: Secondary | ICD-10-CM

## 2018-01-23 DIAGNOSIS — D7589 Other specified diseases of blood and blood-forming organs: Secondary | ICD-10-CM | POA: Diagnosis not present

## 2018-01-23 DIAGNOSIS — Z85828 Personal history of other malignant neoplasm of skin: Secondary | ICD-10-CM | POA: Diagnosis not present

## 2018-01-23 DIAGNOSIS — R5383 Other fatigue: Secondary | ICD-10-CM | POA: Diagnosis not present

## 2018-01-23 DIAGNOSIS — E538 Deficiency of other specified B group vitamins: Secondary | ICD-10-CM | POA: Insufficient documentation

## 2018-01-23 NOTE — Telephone Encounter (Signed)
RTC in 1 year for MD assessment and labs, per 01/23/18 los. Appt mailed. MF

## 2018-01-23 NOTE — Progress Notes (Signed)
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  HPI:  The patient was last seen in the hematology clinic on 01/16/2018 for initial consultation.  At that time, she decribed fatigue.  Diet was good.  She denied liver disease.  She drank minimal alcohol.  Exam was unremarkable.  Family history was notable for her mother with polycythemia rubra vera.  Work-up on 01/16/2018 revealed a hematocrit of 38.7, hemoglobin 13.1, MCV 103.2, platelets 284,000, WBC 6400 with an ANC of 5000.  Retic was 1.7%.  Free T4 was 0.96.  MMA was 162.  Folate was 53.1.  Copper was 84.   Peripheral smear was notable for low RBCs and macrocytosis.  WBC and platelets were normal.  During the interim, she has done well.  She voices no concerns.   Past Medical History:  Diagnosis Date  . Shingles   . Skin cancer   . Vitamin D deficiency 12/25/2017    Past Surgical History:  Procedure Laterality Date  . ABDOMINAL HYSTERECTOMY     pt has one ovary left  . TONSILLECTOMY      Family History  Problem Relation Age of Onset  . Polycythemia Mother   . Heart disease Father   . Heart disease Maternal Grandmother   . Heart disease Maternal Grandfather   . Heart disease Paternal Grandfather     Social History:  reports that she has never smoked. She has never used smokeless tobacco. She reports that she does not drink alcohol or use drugs.  Patient uses very little alcohol. She has never smoked. Patient is originally from [place], [place]. She lives in [place], [place] with her husband. Patient has 3 adult children. 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 The patient is accompanied by husband, Heather Browning, today.  Allergies:  Allergies  Allergen Reactions  . Macrobid  [Nitrofurantoin Macrocrystal] Hives  . Morphine And Related Hives  . Nitrofurantoin Hives    Current Medications: Current Outpatient Medications  Medication Sig Dispense Refill  . benzonatate (TESSALON) 200 MG capsule TAKE 1 CAPSULE BY MOUTH TWICE DAILY AS NEEDED FOR COUGH 20 capsule 0  . cetirizine (ZYRTEC) 10 MG tablet Take 10 mg by mouth daily.    . fluticasone (FLONASE) 50 MCG/ACT nasal spray Place into both nostrils daily.    . Fluticasone-Salmeterol (ADVAIR) 100-50 MCG/DOSE AEPB Inhale 1 puff into the lungs 2 (two) times daily. 1 each 3  . Multiple Vitamins-Minerals (MULTIVITAMIN GUMMIES WOMENS PO) Take by mouth daily.    . valACYclovir (VALTREX) 1000 MG tablet Take 1 tablet (1,000 mg total) by mouth daily. 90 tablet 3  . vitamin B-12 (CYANOCOBALAMIN) 500 MCG tablet Take 1,000 mcg by mouth daily.    . Vitamin D, Ergocalciferol, (DRISDOL) 50000 units CAPS capsule Take 1 capsule (50,000 Units total) by mouth every 7 (seven) days. 12 capsule 0   No current facility-administered medications for this visit.     Review of Systems:  GENERAL:  Fatigue.  No fevers, sweats or weight loss. PERFORMANCE STATUS (ECOG):  1 HEENT:  Rhinorrhea and post nasal drip. No visual changes, sore throat, mouth sores or tenderness. Lungs: Cough. No shortness of breath.  No hemoptysis. Cardiac:  No chest pain, palpitations, orthopnea, or PND. GI:  No nausea, vomiting, diarrhea,  constipation, melena or hematochezia. GU:  No urgency, frequency, dysuria, or hematuria. Musculoskeletal:  Interval swollen hands from "over working".  No back pain.  No joint pain.  No muscle tenderness. Extremities:  No pain or swelling. Skin:  Areas of hypopigmentation to upper anterior chest wall and base of LEFT neck. No rashes other skin changes. Neuro:  Sinus headache.  No numbness or weakness, balance or coordination issues. Endocrine:  No diabetes, thyroid issues, hot flashes or night sweats. Psych:  No mood changes,  depression or anxiety. Pain:  No focal pain. Review of systems:  All other systems reviewed and found to be negative.  Physical Exam: Blood pressure 109/71, pulse 82, temperature (!) 97 F (36.1 C), temperature source Tympanic, resp. rate 18, weight 132 lb 8 oz (60.1 kg). GENERAL:  Well developed, well nourished, woman sitting comfortably in the exam room in no acute distress. MENTAL STATUS:  Alert and oriented to person, place and time. HEAD:  Shoulder length blonde hair.  Normocephalic, atraumatic, face symmetric, no Cushingoid features. EYES:  Glasses.  Blue eyes.  No conjunctivitis or scleral icterus. NEUROLOGICAL: Unremarkable. PSYCH:  Appropriate.   No visits with results within 3 Day(s) from this visit.  Latest known visit with results is:  Appointment on 01/16/2018  Component Date Value Ref Range Status  . Copper 01/16/2018 84  72 - 166 ug/dL Final   Comment: (NOTE) This test was developed and its performance characteristics determined by [facility]. It has not been cleared or approved by the Food and Drug Administration.                                Detection Limit = 5 Performed At: [facility] [place], [place] [id number] Rush Farmer MD FO:[id number] Performed at [facility], [address], [place], [place] [address]   . Free T4 01/16/2018 0.96  0.61 - 1.12 ng/dL Final   Comment: (NOTE) Biotin ingestion may interfere with free T4 tests. If the results are inconsistent with the TSH level, previous test results, or the clinical presentation, then consider biotin interference. If needed, order repeat testing after stopping biotin. Performed at [facility], [address] [place], [place] [address]   . Path Review 01/16/2018 Smear review is notable for low RBCs and macrocytosis.  WBC and platelet morphology is within normal limits. Dr. Luana Shu.   Final   Performed at [facility], [address] [place], [place]  [address]  . Methylmalonic Acid, Quantitative 01/16/2018 162  0 - 378 nmol/L Final  . Disclaimer: 01/16/2018 Comment   Final   Comment: (NOTE) This test was developed and its performance characteristics determined by [facility]. It has not been cleared or approved by the Food and Drug Administration. Performed At: [facility] [place], [place] [id number] Rush Farmer MD [id number] Performed at [facility], [address], [place], [place] [address]   . Folate 01/16/2018 53.1  >5.9 ng/mL Final   Comment: RESULT CONFIRMED BY MANUAL DILUTION JJB Performed at [facility], [address] [place], [place] [address]   . Retic Ct Pct 01/16/2018 1.7  0.4 - 3.1 % Final  . RBC. 01/16/2018 3.86  3.80 - 5.20 MIL/uL Final  . Retic Count, Absolute 01/16/2018 65.6  19.0 - 183.0 K/uL Final   Performed at [facility], [address], [place], [place] [address]  . WBC 01/16/2018 6.4  3.6 - 11.0 K/uL Final  . RBC 01/16/2018 3.75* 3.80 - 5.20 MIL/uL Final  . Hemoglobin 01/16/2018 13.1  12.0 - 16.0 g/dL Final  . HCT 01/16/2018 38.7  35.0 - 47.0 % Final  . MCV 01/16/2018 103.2* 80.0 - 100.0 fL Final  . MCH 01/16/2018 35.0* 26.0 - 34.0 pg Final  . MCHC 01/16/2018 33.9  32.0 - 36.0 g/dL Final  . RDW 01/16/2018 13.8  11.5 - 14.5 % Final  . Platelets 01/16/2018 284  150 - 440 K/uL Final  . Neutrophils Relative % 01/16/2018 78  % Final  . Neutro Abs 01/16/2018 5.0  1.4 - 6.5 K/uL Final  . Lymphocytes Relative 01/16/2018 13  % Final  . Lymphs Abs 01/16/2018 0.8* 1.0 - 3.6 K/uL Final  . Monocytes Relative 01/16/2018 8  % Final  . Monocytes Absolute 01/16/2018 0.5  0.2 - 0.9 K/uL Final  . Eosinophils Relative 01/16/2018 0  % Final  . Eosinophils Absolute 01/16/2018 0.0  0 - 0.7 K/uL Final  . Basophils Relative 01/16/2018 1  % Final  . 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  Diet is good.  She denies liver disease.  She drinks minimal alcohol.  She has been on oral B12 x 4 weeks.  CBC on 12/13/2017 revealed a hematocrit of 41.7, hemoglobin 13.4, MCV 103, platelets 264,000, WBC 5700 with an ANC of 3500.  Differential was unremarkable.  TSH was normal.  B12 was 385.    Work-up on 01/16/2018 revealed a hematocrit of 38.7, hemoglobin 13.1, MCV 103.2, platelets 284,000, WBC 6400 with an ANC of 5000.  Retic was 1.7%.  Free T4, MMA , folate, and copper were normal.  Peripheral smear revealed only macrocytosis.  Her mother has polycythemia rubra vera (PV) and is on anagrelide.  Symptomatically, she notes fatigue.  Exam is unremarkable.  Plan: 1.  Review work-up.  Etiology unknown.  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  Discuss surveillance. 2.  ROI for records (prior CBCs) from [place], [place].  Patient previously saw Dr York Pellant Cruz-Hillis [phone]). 3.  RTC in 1 year for MD assessment and labs (CBC with diff).   Lequita Asal, MD  01/23/2018, 3:45 PM

## 2018-01-23 NOTE — Progress Notes (Signed)
Here for follow up. Stated over all doing well  

## 2018-07-02 NOTE — Telephone Encounter (Signed)
Opened in error

## 2018-07-29 ENCOUNTER — Encounter: Payer: Self-pay | Admitting: Family Medicine

## 2018-07-29 ENCOUNTER — Ambulatory Visit (INDEPENDENT_AMBULATORY_CARE_PROVIDER_SITE_OTHER): Payer: 59 | Admitting: Family Medicine

## 2018-07-29 VITALS — BP 120/62 | HR 80 | Ht 67.0 in | Wt 135.0 lb

## 2018-07-29 DIAGNOSIS — Z7689 Persons encountering health services in other specified circumstances: Secondary | ICD-10-CM

## 2018-07-29 DIAGNOSIS — R160 Hepatomegaly, not elsewhere classified: Secondary | ICD-10-CM | POA: Diagnosis not present

## 2018-07-29 DIAGNOSIS — R05 Cough: Secondary | ICD-10-CM

## 2018-07-29 DIAGNOSIS — R059 Cough, unspecified: Secondary | ICD-10-CM

## 2018-07-29 MED ORDER — BENZONATATE 100 MG PO CAPS: 100.0000 mg | ORAL_CAPSULE | Freq: Two times a day (BID) | ORAL | 0 refills | Status: DC | PRN

## 2018-07-29 MED ORDER — TRIAMCINOLONE ACETONIDE 55 MCG/ACT NA AERO: 2.0000 | INHALATION_SPRAY | Freq: Every day | NASAL | 12 refills | Status: DC

## 2018-07-29 MED ORDER — MONTELUKAST SODIUM 10 MG PO TABS: 10.0000 mg | ORAL_TABLET | Freq: Every day | ORAL | 5 refills | Status: DC

## 2018-07-29 NOTE — Progress Notes (Signed)
Date:  07/29/2018   Name:  Heather Browning   DOB:  [birth year]   MRN:  [id number]   Chief Complaint: Establish Care (new to area) and Cough (was seen in Sept for sinusitis in [facility]- gave her Augmentin, Tussionex and Tessalon Perles. Pulmonology gave her Advair at the first of the year. Cough has a green/ brown production to it.) Cough  This is a recurrent problem. The current episode started more than 1 month ago. The problem has been waxing and waning. The cough is non-productive. Associated symptoms include ear pain, nasal congestion, postnasal drip, rhinorrhea and wheezing. Pertinent negatives include no chest pain, chills, ear congestion, eye redness, fever, headaches, heartburn, hemoptysis, myalgias, rash, sore throat, shortness of breath, sweats or weight loss. The symptoms are aggravated by exercise. She has tried a beta-agonist inhaler and steroid inhaler for the symptoms. The treatment provided mild relief. There is no history of environmental allergies. reactive airway disease     Review of Systems  Constitutional: Negative.  Negative for chills, fatigue, fever, unexpected weight change and weight loss.  HENT: Positive for ear pain, postnasal drip and rhinorrhea. Negative for congestion, ear discharge, sinus pressure, sneezing and sore throat.   Eyes: Negative for photophobia, pain, discharge, redness and itching.  Respiratory: Positive for cough and wheezing. Negative for hemoptysis, shortness of breath and stridor.   Cardiovascular: Negative for chest pain.  Gastrointestinal: Negative for abdominal pain, blood in stool, constipation, diarrhea, heartburn, nausea and vomiting.  Endocrine: Negative for cold intolerance, heat intolerance, polydipsia, polyphagia and polyuria.  Genitourinary: Negative for dysuria, flank pain, frequency, hematuria, menstrual problem, pelvic pain, urgency, vaginal bleeding and vaginal discharge.  Musculoskeletal: Negative for arthralgias, back pain and myalgias.    Skin: Negative for rash.  Allergic/Immunologic: Negative for environmental allergies and food allergies.  Neurological: Negative for dizziness, weakness, light-headedness, numbness and headaches.  Hematological: Negative for adenopathy. Does not bruise/bleed easily.  Psychiatric/Behavioral: Negative for dysphoric mood. The patient is not nervous/anxious.     Patient Active Problem List   Diagnosis Date Noted  . Low serum vitamin B12 01/18/2018  . 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 Vitamin D deficiency 12/25/2017  . Zoster with other complications 09/38/1829    Allergies  Allergen Reactions  . Macrobid [Nitrofurantoin Macrocrystal] Hives  . Morphine And Related Hives  . Nitrofurantoin Hives    Past Surgical History:  Procedure Laterality Date  . ABDOMINAL HYSTERECTOMY     pt has one ovary left  . TONSILLECTOMY      Social History   Tobacco Use  . Smoking status: Never Smoker  . Smokeless tobacco: Never Used  Substance Use Topics  . Alcohol use: Yes    Frequency: Never    Comment: once a month/ wine  . Drug use: No     Medication list has been reviewed and updated.  Current Meds  Medication Sig  . benzonatate (TESSALON) 200 MG capsule TAKE 1 CAPSULE BY MOUTH TWICE DAILY AS NEEDED FOR COUGH  . Fluticasone-Salmeterol (ADVAIR) 100-50 MCG/DOSE AEPB Inhale 1 puff into the lungs 2 (two) times daily.  . Multiple Vitamins-Minerals (MULTIVITAMIN GUMMIES WOMENS PO) Take by mouth daily.  . valACYclovir (VALTREX) 1000 MG tablet Take 1 tablet (1,000 mg total) by mouth daily.  . vitamin B-12 (CYANOCOBALAMIN) 500 MCG tablet Take 1,000 mcg by mouth daily.    PHQ 2/9 Scores 07/29/2018 10/25/2017  PHQ - 2 Score 0 0  PHQ- 9 Score 1 -    Physical  Exam  Constitutional: She is oriented to person, place, and time. She appears well-developed and well-nourished.  HENT:  Head: Normocephalic.  Right Ear: Hearing, tympanic membrane, external ear and ear canal  normal.  Left Ear: Hearing, tympanic membrane, external ear and ear canal normal.  Nose: Nose normal. No mucosal edema or rhinorrhea.  Mouth/Throat: Oropharynx is clear and moist. No oropharyngeal exudate, posterior oropharyngeal edema or posterior oropharyngeal erythema.  Eyes: Pupils are equal, round, and reactive to light. Conjunctivae and EOM are normal. Lids are everted and swept, no foreign bodies found. Left eye exhibits no hordeolum. No foreign body present in the left eye. Right conjunctiva is not injected. Left conjunctiva is not injected. No scleral icterus.  Neck: Normal range of motion. Neck supple. No JVD present. No tracheal deviation present. No thyromegaly present.  Cardiovascular: Normal rate, regular rhythm, normal heart sounds and intact distal pulses. Exam reveals no gallop and no friction rub.  No murmur heard. Pulmonary/Chest: Effort normal and breath sounds normal. No stridor. No respiratory distress. She has no wheezes. She has no rales.  Abdominal: Soft. Bowel sounds are normal. She exhibits no mass. There is hepatomegaly. There is no hepatosplenomegaly. There is no tenderness. There is no rebound and no guarding.  Musculoskeletal: Normal range of motion. She exhibits no edema or tenderness.  Lymphadenopathy:    She has no cervical adenopathy.  Neurological: She is alert and oriented to person, place, and time. She has normal strength. She displays normal reflexes. No cranial nerve deficit.  Skin: Skin is warm. No rash noted.  Psychiatric: She has a normal mood and affect. Her mood appears not anxious. She does not exhibit a depressed mood.  Nursing note and vitals reviewed.   BP 120/62   Pulse 80   Ht 5\' 7"  (1.702 m)   Wt 135 lb (61.2 kg)   BMI 21.14 kg/m   Assessment and Plan:  1. Enlarged liver Order [place] of abdomen/ draw liver enzymes - Hepatic function panel - US Abdomen Complete; Future  2. Establishing care with new doctor, encounter for Continuing  care  3. Cough Refill tessalon perles- if not better send to allergy specialist - benzonatate (TESSALON) 100 MG capsule; Take 1 capsule (100 mg total) by mouth 2 (two) times daily as needed for cough.  Dispense: 20 capsule; Refill: 0   Dr. Mac[facility]  07/29/2018

## 2018-07-30 LAB — HEPATIC FUNCTION PANEL
ALBUMIN: 4.6 g/dL (ref 3.6–4.8)
ALK PHOS: 67 IU/L (ref 39–117)
ALT: 14 IU/L (ref 0–32)
AST: 16 IU/L (ref 0–40)
BILIRUBIN, DIRECT: 0.09 mg/dL (ref 0.00–0.40)
Bilirubin Total: 0.3 mg/dL (ref 0.0–1.2)
TOTAL PROTEIN: 6.4 g/dL (ref 6.0–8.5)

## 2018-08-01 ENCOUNTER — Ambulatory Visit
Admission: RE | Admit: 2018-08-01 | Discharge: 2018-08-01 | Disposition: A | Discharge status: Home / Self Care | Payer: 59 | Source: Ambulatory Visit | Attending: Family Medicine | Admitting: Family Medicine

## 2018-08-01 DIAGNOSIS — R160 Hepatomegaly, not elsewhere classified: Secondary | ICD-10-CM

## 2018-08-01 DIAGNOSIS — K824 Cholesterolosis of gallbladder: Secondary | ICD-10-CM | POA: Diagnosis not present

## 2018-09-11 ENCOUNTER — Encounter: Payer: Self-pay | Admitting: Family Medicine

## 2018-09-11 ENCOUNTER — Ambulatory Visit: Payer: 59 | Admitting: Family Medicine

## 2018-09-11 VITALS — BP 138/80 | HR 70 | Ht 67.0 in | Wt 140.0 lb

## 2018-09-11 DIAGNOSIS — J0100 Acute maxillary sinusitis, unspecified: Secondary | ICD-10-CM

## 2018-09-11 MED ORDER — AMOXICILLIN-POT CLAVULANATE 875-125 MG PO TABS: 1.0000 | ORAL_TABLET | Freq: Two times a day (BID) | ORAL | 0 refills | Status: DC

## 2018-09-11 NOTE — Progress Notes (Signed)
Date:  09/11/2018   Name:  Heather Browning   DOB:  1956/04/24   MRN:  [id number]   Chief Complaint: Sinusitis (started 08/18/18- was having fever over weekend, had a "lump"in the back of the throat- spit it out and it was blood) Sinusitis  This is a new problem. The current episode started 1 to 4 weeks ago (end of October). The problem has been waxing and waning since onset. The maximum temperature recorded prior to her arrival was 100.4 - 100.9 F. The fever has been present for less than 1 day. The pain is moderate. Associated symptoms include congestion, headaches and sinus pressure. Pertinent negatives include no chills, coughing, diaphoresis, ear pain, hoarse voice, neck pain, shortness of breath, sneezing, sore throat or swollen glands. (Bloody postnasal drainage) Past treatments include nothing. The treatment provided moderate relief.     Review of Systems  Constitutional: Negative.  Negative for chills, diaphoresis, fatigue, fever and unexpected weight change.  HENT: Positive for congestion and sinus pressure. Negative for ear discharge, ear pain, hoarse voice, rhinorrhea, sneezing and sore throat.   Eyes: Negative for photophobia, pain, discharge, redness and itching.  Respiratory: Negative for cough, shortness of breath, wheezing and stridor.   Gastrointestinal: Negative for abdominal pain, blood in stool, constipation, diarrhea, nausea and vomiting.  Endocrine: Negative for cold intolerance, heat intolerance, polydipsia, polyphagia and polyuria.  Genitourinary: Negative for dysuria, flank pain, frequency, hematuria, menstrual problem, pelvic pain, urgency, vaginal bleeding and vaginal discharge.  Musculoskeletal: Negative for arthralgias, back pain, myalgias and neck pain.  Skin: Negative for rash.  Allergic/Immunologic: Negative for environmental allergies and food allergies.  Neurological: Positive for headaches. Negative for dizziness, weakness, light-headedness and numbness.    Hematological: Negative for adenopathy. Does not bruise/bleed easily.  Psychiatric/Behavioral: Negative for dysphoric mood. The patient is not nervous/anxious.     Patient Active Problem List   Diagnosis Date Noted  . Low serum vitamin B12 01/18/2018  . 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 Vitamin D deficiency 12/25/2017  . Zoster with other complications 58/59/2924    Allergies  Allergen Reactions  . Macrobid [Nitrofurantoin Macrocrystal] Hives  . Morphine And Related Hives  . Nitrofurantoin Hives    Past Surgical History:  Procedure Laterality Date  . ABDOMINAL HYSTERECTOMY     pt has one ovary left  . TONSILLECTOMY      Social History   Tobacco Use  . Smoking status: Never Smoker  . Smokeless tobacco: Never Used  Substance Use Topics  . Alcohol use: Yes    Frequency: Never    Comment: once a month/ wine  . Drug use: No     Medication list has been reviewed and updated.  Current Meds  Medication Sig  . Multiple Vitamins-Minerals (MULTIVITAMIN GUMMIES WOMENS PO) Take by mouth daily.  Marland Kitchen triamcinolone (NASACORT) 55 MCG/ACT AERO nasal inhaler Place 2 sprays into the nose daily.  . valACYclovir (VALTREX) 1000 MG tablet Take 1 tablet (1,000 mg total) by mouth daily.  . vitamin B-12 (CYANOCOBALAMIN) 500 MCG tablet Take 1,000 mcg by mouth daily.    PHQ 2/9 Scores 09/11/2018 07/29/2018 10/25/2017  PHQ - 2 Score 0 0 0  PHQ- 9 Score 0 1 -    Physical Exam  Constitutional: She is oriented to person, place, and time. She appears well-developed and well-nourished.  HENT:  Head: Normocephalic.  Right Ear: Hearing, tympanic membrane, external ear and ear canal normal.  Left Ear: Hearing, tympanic membrane, external ear and  ear canal normal.  Nose: Nose normal.  Mouth/Throat: Uvula is midline and oropharynx is clear and moist.  Eyes: Pupils are equal, round, and reactive to light. Conjunctivae and EOM are normal. Lids are everted and swept, no foreign  bodies found. Left eye exhibits no hordeolum. No foreign body present in the left eye. Right conjunctiva is not injected. Left conjunctiva is not injected. No scleral icterus.  Neck: Normal range of motion. Neck supple. No JVD present. No tracheal deviation present. No thyromegaly present.  Cardiovascular: Normal rate, regular rhythm, normal heart sounds and intact distal pulses. Exam reveals no gallop and no friction rub.  No murmur heard. Pulmonary/Chest: Effort normal and breath sounds normal. No respiratory distress. She has no wheezes. She has no rales.  Abdominal: Soft. Bowel sounds are normal. She exhibits no mass. There is no hepatosplenomegaly. There is no tenderness. There is no rebound and no guarding.  Musculoskeletal: Normal range of motion. She exhibits no edema or tenderness.  Lymphadenopathy:    She has no cervical adenopathy.  Neurological: She is alert and oriented to person, place, and time. She has normal strength. She displays normal reflexes. No cranial nerve deficit.  Skin: Skin is warm. No rash noted.  Psychiatric: She has a normal mood and affect. Her mood appears not anxious. She does not exhibit a depressed mood.  Nursing note and vitals reviewed.   BP 138/80   Pulse 70   Ht 5\' 7"  (1.702 m)   Wt 140 lb (63.5 kg)   BMI 21.93 kg/m   Assessment and Plan:  1. Acute maxillary sinusitis, recurrence not specified Prescribed Augmentin 875/125mg  bid x 10 days Start back on Singulair and continue Nasacort nasal spray   Dr. [facility]  09/11/2018

## 2018-12-14 ENCOUNTER — Other Ambulatory Visit: Payer: Self-pay | Admitting: Unknown Physician Specialty

## 2019-01-09 DIAGNOSIS — J0101 Acute recurrent maxillary sinusitis: Secondary | ICD-10-CM | POA: Insufficient documentation

## 2019-01-19 ENCOUNTER — Other Ambulatory Visit: Payer: Self-pay

## 2019-01-20 ENCOUNTER — Inpatient Hospital Stay: Payer: 59 | Attending: Hematology and Oncology

## 2019-01-20 DIAGNOSIS — D7589 Other specified diseases of blood and blood-forming organs: Secondary | ICD-10-CM | POA: Insufficient documentation

## 2019-01-20 LAB — CBC WITH DIFFERENTIAL/PLATELET
Abs Immature Granulocytes: 0.01 10*3/uL (ref 0.00–0.07)
Basophils Absolute: 0.1 10*3/uL (ref 0.0–0.1)
Basophils Relative: 1 %
Eosinophils Absolute: 0.1 10*3/uL (ref 0.0–0.5)
Eosinophils Relative: 2 %
HCT: 39 % (ref 36.0–46.0)
Hemoglobin: 13.2 g/dL (ref 12.0–15.0)
Immature Granulocytes: 0 %
Lymphocytes Relative: 34 %
Lymphs Abs: 1.8 10*3/uL (ref 0.7–4.0)
MCH: 34.3 pg — ABNORMAL HIGH (ref 26.0–34.0)
MCHC: 33.8 g/dL (ref 30.0–36.0)
MCV: 101.3 fL — ABNORMAL HIGH (ref 80.0–100.0)
Monocytes Absolute: 0.4 10*3/uL (ref 0.1–1.0)
Monocytes Relative: 8 %
Neutro Abs: 2.9 10*3/uL (ref 1.7–7.7)
Neutrophils Relative %: 55 %
Platelets: 224 10*3/uL (ref 150–400)
RBC: 3.85 MIL/uL — ABNORMAL LOW (ref 3.87–5.11)
RDW: 12 % (ref 11.5–15.5)
WBC: 5.3 10*3/uL (ref 4.0–10.5)
nRBC: 0 % (ref 0.0–0.2)

## 2019-01-22 ENCOUNTER — Telehealth: Payer: Self-pay | Admitting: Hematology and Oncology

## 2019-01-22 ENCOUNTER — Other Ambulatory Visit: Payer: 59

## 2019-01-23 ENCOUNTER — Encounter: Payer: Self-pay | Admitting: Hematology and Oncology

## 2019-01-23 ENCOUNTER — Other Ambulatory Visit: Payer: 59

## 2019-01-23 ENCOUNTER — Inpatient Hospital Stay: Payer: 59 | Attending: Hematology and Oncology | Admitting: Hematology and Oncology

## 2019-01-23 ENCOUNTER — Ambulatory Visit: Payer: 59 | Admitting: Hematology and Oncology

## 2019-01-23 VITALS — BP 125/72 | HR 80 | Temp 97.9°F | Resp 18

## 2019-01-23 DIAGNOSIS — D7589 Other specified diseases of blood and blood-forming organs: Secondary | ICD-10-CM | POA: Diagnosis not present

## 2019-01-23 DIAGNOSIS — R5383 Other fatigue: Secondary | ICD-10-CM | POA: Diagnosis not present

## 2019-01-23 NOTE — Progress Notes (Signed)
[facility]  [address], [address] [place], [place] [address] Phone: [phone]  Fax: [phone]   Telephone Office Visit:  01/23/2019  I connected with Heather Browning on 01/23/19 at 2:03 PM EDT by telephone and verified that I was speaking with the correct person using 2 identifiers.  The patient was at home.  I discussed the limitations, risk, security and privacy concerns of performing an evaluation and management service by telephone and  the availability of in person appointments.  I also discussed with the patient that there may be a patient responsible charge related to this service.  The patient expressed understanding and agreed to proceed.    Chief Complaint: Heather Browning is a 63 y.o. 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  HPI:  The patient was last seen in the hematology clinic on 02/02/2018 for initial consultation.  At that time, she felt fatigued.  Exam was unremarkable.  Work-up suggested possible MDS.  We discussed ongoing surveillance.  During the interim, she has felt "ok".  She noted more energy until February when she started to cough and sneeze.  She describes some tiredness.  She has a cough and chest tightness for which she has seen Dr. Raul Del. She was put on inhalers.  Cough was "fierce" and caused incontinence.  Singulair resolved the issue.  Cough is under control and is not worse outdoors.  Symptoms seem to be improving with the change in season.  She is considering allergy testing.  She has not been taking B12.   Past Medical History:  Diagnosis Date  . Shingles   . Skin cancer   . Vitamin D deficiency 12/25/2017    Past Surgical History:  Procedure Laterality Date  . ABDOMINAL HYSTERECTOMY     pt has one ovary left  . TONSILLECTOMY      Family History  Problem Relation Age of Onset  . Polycythemia Mother   . Heart disease Father   . Heart disease Maternal Grandmother   . Heart disease Maternal  Grandfather   . Heart disease Paternal Grandfather     Social History:  reports that she has never smoked. She has never used smokeless tobacco. She reports current alcohol use. She reports that she does not use drugs.  Patient uses very little alcohol. She has never smoked. Patient is originally from [place], [place]. She lives in [place], [place] with her husband. Patient has 3 adult children. 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  Participants in the patient's visit included the patient and Vito Berger, CMA, today.  The intake visit was provided by Vito Berger, CMA.  Allergies:  Allergies  Allergen Reactions  . Macrobid [Nitrofurantoin Macrocrystal] Hives  . Morphine And Related Hives  . Nitrofurantoin Hives    Current Medications: Current Outpatient Medications  Medication Sig Dispense Refill  . Multiple Vitamins-Minerals (MULTIVITAMIN GUMMIES WOMENS PO) Take by mouth daily.    . valACYclovir (VALTREX) 1000 MG tablet TAKE 1 TABLET BY MOUTH ONCE DAILY 30 tablet 0  . amoxicillin-clavulanate (AUGMENTIN) 875-125 MG tablet Take 1 tablet by mouth 2 (two) times daily. (Patient not taking: Reported on 01/23/2019) 20 tablet 0  . benzonatate (TESSALON) 100 MG capsule Take 1 capsule (100 mg total) by mouth 2 (two) times daily as needed for cough. (Patient not taking: Reported on 09/11/2018) 20 capsule 0  . Fluticasone-Salmeterol (ADVAIR) 100-50 MCG/DOSE AEPB Inhale  1 puff into the lungs 2 (two) times daily. (Patient not taking: Reported on 09/11/2018) 1 each 3  . montelukast (SINGULAIR) 10 MG tablet Take 1 tablet (10 mg total) by mouth at bedtime. (Patient not taking: Reported on 09/11/2018) 30 tablet 5  . triamcinolone (NASACORT) 55 MCG/ACT AERO nasal inhaler Place 2 sprays into the nose daily. (Patient not taking: Reported on 01/23/2019) 1 Inhaler 12  . vitamin B-12 (CYANOCOBALAMIN) 500 MCG tablet Take 1,000 mcg  by mouth daily.     No current facility-administered medications for this visit.     Review of Systems:  GENERAL:  Feels "ok".  Some fatigue.  No fevers, sweats or weight loss. PERFORMANCE STATUS (ECOG):  1 HEENT:  No visual changes, runny nose, sore throat, mouth sores or tenderness. Lungs: No shortness of breath.  Cough and wheezing, controlled.  No hemoptysis. Cardiac:  No chest pain, palpitations, orthopnea, or PND. GI:  No nausea, vomiting, diarrhea, constipation, melena or hematochezia. GU:  No urgency, frequency, dysuria, or hematuria. Musculoskeletal:  No back pain.  No joint pain.  No muscle tenderness. Extremities:  No pain or swelling. Skin:  No rashes or skin changes. Neuro:  No headache, numbness or weakness, balance or coordination issues. Endocrine:  No diabetes, thyroid issues, hot flashes or night sweats. Psych:  No mood changes, depression or anxiety. Pain:  No focal pain. Review of systems:  All other systems reviewed and found to be negative.   Physical Exam: Blood pressure 125/72, pulse 80, temperature 97.9 F (36.6 C), temperature source Tympanic, resp. rate 18, SpO2 99 %. on 01/20/2019. No exam performed.   No visits with results within 3 Day(s) from this visit.  Latest known visit with results is:  Appointment on 01/20/2019  Component Date Value Ref Range Status  . WBC 01/20/2019 5.3  4.0 - 10.5 K/uL Final  . RBC 01/20/2019 3.85* 3.87 - 5.11 MIL/uL Final  . Hemoglobin 01/20/2019 13.2  12.0 - 15.0 g/dL Final  . HCT 01/20/2019 39.0  36.0 - 46.0 % Final  . MCV 01/20/2019 101.3* 80.0 - 100.0 fL Final  . MCH 01/20/2019 34.3* 26.0 - 34.0 pg Final  . MCHC 01/20/2019 33.8  30.0 - 36.0 g/dL Final  . RDW 01/20/2019 12.0  11.5 - 15.5 % Final  . Platelets 01/20/2019 224  150 - 400 K/uL Final  . nRBC 01/20/2019 0.0  0.0 - 0.2 % Final  . Neutrophils Relative % 01/20/2019 55  % Final  . Neutro Abs 01/20/2019 2.9  1.7 - 7.7 K/uL Final  . Lymphocytes Relative  01/20/2019 34  % Final  . Lymphs Abs 01/20/2019 1.8  0.7 - 4.0 K/uL Final  . Monocytes Relative 01/20/2019 8  % Final  . Monocytes Absolute 01/20/2019 0.4  0.1 - 1.0 K/uL Final  . Eosinophils Relative 01/20/2019 2  % Final  . Eosinophils Absolute 01/20/2019 0.1  0.0 - 0.5 K/uL Final  . Basophils Relative 01/20/2019 1  % Final  . Basophils Absolute 01/20/2019 0.1  0.0 - 0.1 K/uL Final  . Immature Granulocytes 01/20/2019 0  % Final  . Abs Immature Granulocytes 01/20/2019 0.01  0.00 - 0.07 K/uL Final   Performed at [facility], [address], [place], [place] [address]    Assessment:  Heather Browning is a 63 y.o. 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  Diet is good.  She denies liver disease.  She drinks minimal alcohol.  She was on oral B12 x 4 weeks.  CBC on 12/13/2017 revealed  a hematocrit of 41.7, hemoglobin 13.4, MCV 103, platelets 264,000, WBC 5700 with an ANC of 3500.  Differential was unremarkable.  TSH was normal.  B12 was 385.    Work-up on 01/16/2018 revealed a hematocrit of 38.7, hemoglobin 13.1, MCV 103.2, platelets 284,000, WBC 6400 with an ANC of 5000.  Retic was 1.7%.  Free T4, MMA , folate, and copper were normal.  Peripheral smear revealed only macrocytosis.  Her mother has polycythemia rubra vera (PV) and is on anagrelide.  Symptomatically, she notes some fatigue since 11/2018.  She has had an allergy related cough and reactive airway disease.  Plan: 1.   Review labs from 01/20/2019. 2.   Macrocytosis  Hemoglobin 13.2.  MCV 101.3.  Macrocytosis has improved.  No longer taking B12.  No known liver disease.  Discuss unlikely MDS given improvement in macrocytosis.  Continue yearly surveillance. 3.   Reactive airway disease  Discuss interval follow-up with Dr. Raul Del.  Discuss allergy testing suggested by Dr. Raul Del. 4.   RTC in 1 year for MD assessment and labs (CBC with diff, B12, folate, TSH).  I discussed the assessment and treatment plan with the patient.  The  patient was provided an opportunity to ask questions and all were answered.  The patient agreed with the plan and demonstrated an understanding of the instructions.  The patient was advised to call back or seek an in person evaluation if the symptoms worsen or if the condition fails to improve as anticipated.  I provided 14 minutes (2:03 PM - 2:17 PM) of non-face-to-face time during this encounter.  I provided these services from the [facility] office.   Lequita Asal, MD, PhD  01/23/2019, 2:13 PM

## 2019-01-23 NOTE — Progress Notes (Signed)
No new changes noted today. The patient DOB, name  and address has been verified with tele visit today, cbg, CMA

## 2019-05-13 DIAGNOSIS — L709 Acne, unspecified: Secondary | ICD-10-CM | POA: Insufficient documentation

## 2020-01-20 NOTE — Progress Notes (Incomplete)
? ?  [facility]  ?[address] ?[place], [place] [address] ?Phone: [phone] ? Fax: [phone] ? ? ?Office Visit:  01/20/2020 ? ?Referring physician: Roselee Culver, MD ? ?Chief Complaint: Heather Browning is a 64 y.o. 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 ? ?HPI:  The patient was last seen in the hematology clinic on 01/23/2019. At that time, she noted some prolonged fatigue. She had an allergy related cough and reactive airway disease. ? ?*** ? ?During the interim, ***  ? ? ?Past Medical History:  ?Diagnosis Date  ?? Shingles   ?? Skin cancer   ?? Vitamin D deficiency 12/25/2017  ? ? ?Past Surgical History:  ?Procedure Laterality Date  ?? ABDOMINAL HYSTERECTOMY    ? pt has one ovary left  ?? TONSILLECTOMY    ? ? ?Family History  ?Problem Relation Age of Onset  ?? Polycythemia Mother   ?? Heart disease Father   ?? Heart disease Maternal Grandmother   ?? Heart disease Maternal Grandfather   ?? Heart disease Paternal Grandfather   ? ? ?Social History:  reports that she has never smoked. She has never used smokeless tobacco. She reports current alcohol use. She reports that she does not use drugs.  Patient uses very little alcohol. She has never smoked. Patient is originally from [place], [place]. She lives in [place], [place] with her husband. Patient has 3 adult children. 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 ? ? ?Allergies:  ?Allergies  ?Allergen Reactions  ?? Macrobid [Nitrofurantoin Macrocrystal] Hives  ?? Morphine And Related Hives  ?? Nitrofurantoin Hives  ? ? ?Current Medications: ?Current Outpatient Medications  ?Medication Sig Dispense Refill  ?? amoxicillin-clavulanate (AUGMENTIN) 875-125 MG tablet Take 1 tablet by mouth 2 (two) times daily. (Patient not taking: Reported on 01/23/2019) 20 tablet 0  ? ? benzonatate (TESSALON) 100 MG capsule Take 1 capsule (100 mg total) by mouth 2 (two) times daily as needed for cough. (Patient no

## 2020-01-22 ENCOUNTER — Encounter: Payer: Self-pay | Admitting: Hematology and Oncology

## 2020-01-22 ENCOUNTER — Inpatient Hospital Stay: Payer: 59

## 2020-01-22 ENCOUNTER — Inpatient Hospital Stay: Payer: 59 | Attending: Hematology and Oncology | Admitting: Hematology and Oncology

## 2020-01-22 ENCOUNTER — Other Ambulatory Visit: Payer: Self-pay

## 2020-01-22 DIAGNOSIS — D7589 Other specified diseases of blood and blood-forming organs: Secondary | ICD-10-CM | POA: Insufficient documentation

## 2020-01-22 NOTE — Progress Notes (Signed)
No new changes noted today. The patient Name and DOB has been verified by phone today. 

## 2020-01-23 NOTE — Progress Notes (Signed)
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   HPI: The patient was last seen in the hematology clinic on 01/23/2019 via telephone. At that time, she noted some fatigue since 11/2018. She had an allergy related cough and reactive airway disease.  Labs on 01/20/2019 revealed a hematocrit was 39.0, hemoglobin 13.2, MCV 101.3, platelets 224,000, WBC 5,300.  Surveillance continued.   During the interim, the patient reports feeling tired a lot but it is gradually getting better. She has to take her time doing any excessive chores around the home. Due to no major concerns, patient is okay with yearly follow ups. Patient received her last COVID-19 vaccine on 12/25/2019 with no side effects.    Past Medical History:  Diagnosis Date  . Shingles   . Skin cancer   . Vitamin D deficiency 12/25/2017    Past Surgical History:  Procedure Laterality Date  . ABDOMINAL HYSTERECTOMY     pt has one ovary left  . TONSILLECTOMY      Family History  Problem Relation Age of Onset  . Polycythemia Mother   . Heart disease Father   . Heart disease Maternal Grandmother   . Heart disease Maternal Grandfather   . Heart disease Paternal Grandfather     Social History:  reports that she has never smoked. She has never used smokeless tobacco. She reports current alcohol use. She reports that she does not use drugs. Patient uses very little alcohol. She has never smoked. Patient is originally from [place], [place]. She lives in [place], [place] with her husband. Patient has 3 adult children. 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 The patient is alone today.  Allergies:  Allergies   Allergen Reactions  . Macrobid [Nitrofurantoin Macrocrystal] Hives  . Morphine And Related Hives  . Nitrofurantoin Hives    Current Medications: Current Outpatient Medications  Medication Sig Dispense Refill  . Multiple Vitamins-Minerals (MULTIVITAMIN GUMMIES WOMENS PO) Take by mouth daily.    . valACYclovir (VALTREX) 1000 MG tablet TAKE 1 TABLET BY MOUTH ONCE DAILY 30 tablet 0  . vitamin B-12 (CYANOCOBALAMIN) 500 MCG tablet Take 1,000 mcg by mouth daily.    Marland Kitchen amoxicillin-clavulanate (AUGMENTIN) 875-125 MG tablet Take 1 tablet by mouth 2 (two) times daily. (Patient not taking: Reported on 01/23/2019) 20 tablet 0  . benzonatate (TESSALON) 100 MG capsule Take 1 capsule (100 mg total) by mouth 2 (two) times daily as needed for cough. (Patient not taking: Reported on 09/11/2018) 20 capsule 0  . Fluticasone-Salmeterol (ADVAIR) 100-50 MCG/DOSE AEPB Inhale 1 puff into the lungs 2 (two) times daily. (Patient not taking: Reported on 09/11/2018) 1 each 3  . montelukast (SINGULAIR) 10 MG tablet Take 1 tablet (10 mg total) by mouth at bedtime. (Patient not taking: Reported on 09/11/2018) 30 tablet 5  . triamcinolone (NASACORT) 55 MCG/ACT AERO nasal inhaler Place 2 sprays into the nose daily. (Patient not taking: Reported on 01/23/2019) 1 Inhaler 12   No current facility-administered medications for this visit.    Review of Systems  Constitutional: Positive for malaise/fatigue. Negative for chills, diaphoresis, fever and weight loss (up 6 pounds).       Feels "ok".  HENT: Negative for congestion, ear discharge, ear pain, hearing loss, nosebleeds, sinus pain and sore throat.   Eyes: Negative for blurred vision.  Respiratory: Negative for cough, hemoptysis, sputum production and shortness of breath.   Cardiovascular: Negative for chest pain, palpitations and leg swelling.  Gastrointestinal: Negative for abdominal pain, blood in stool, constipation, diarrhea, heartburn, melena, nausea and vomiting.   Genitourinary: Negative for dysuria, frequency, hematuria and urgency.  Musculoskeletal: Negative for back pain, joint pain, myalgias and neck pain.  Skin: Negative for itching and rash.  Neurological: Negative for dizziness, tingling, sensory change, weakness and headaches.  Endo/Heme/Allergies: Does not bruise/bleed easily.  Psychiatric/Behavioral: Negative for depression and memory loss. The patient is not nervous/anxious and does not have insomnia.   All other systems reviewed and are negative.  Performance status (ECOG): 1  Vitals Blood pressure 139/79, pulse 72, temperature 97.7 F (36.5 C), temperature source Tympanic, resp. rate 18, weight 138 lb 14.2 oz (63 kg), SpO2 100 %.   Physical Exam Vitals and nursing note reviewed.  Constitutional:      General: She is not in acute distress.    Appearance: She is well-developed and well-nourished. She is not diaphoretic.  HENT:     Head: Normocephalic and atraumatic.     Mouth/Throat:     Mouth: Oropharynx is clear and moist.     Pharynx: No oropharyngeal exudate.      Comments: Blonde/brown shoulder length hair. Mask. Eyes:     General: No scleral icterus.    Extraocular Movements: EOM normal.     Conjunctiva/sclera: Conjunctivae normal.     Pupils: Pupils are equal, round, and reactive to light.     Comments: Glasses.  Neck:     Vascular: No JVD.  Cardiovascular:     Rate and Rhythm: Normal rate and regular rhythm.     Heart sounds: Normal heart sounds. No murmur heard.   Pulmonary:     Effort: Pulmonary effort is normal. No respiratory distress.     Breath sounds: Normal breath sounds. No wheezing or rales.  Chest:     Chest wall: No tenderness.  Abdominal:     General: Bowel sounds are normal. There is no distension.     Palpations: Abdomen is soft. There is no mass.     Tenderness: There is no abdominal tenderness. There is no guarding or rebound.  Musculoskeletal:        General: No tenderness or edema. Normal  range of motion.     Cervical back: Normal range of motion and neck supple.  Lymphadenopathy:     Cervical: No cervical adenopathy.     Upper Body:  No axillary adenopathy present.    Right upper body: No supraclavicular adenopathy.     Left upper body: No supraclavicular adenopathy.  Skin:    General: Skin is warm and dry.  Neurological:     Mental Status: She is alert and oriented to person, place, and time.  Psychiatric:        Mood and Affect: Mood and affect normal.        Behavior: Behavior normal.        Thought Content: Thought content normal.        Judgment: Judgment normal.    Appointment on 01/25/2020  Component Date Value Ref Range Status  . WBC 01/25/2020 3.6* 4.0 - 10.5 K/uL Final  . RBC 01/25/2020 4.06  3.87 - 5.11 MIL/uL Final  . Hemoglobin 01/25/2020 13.6  12.0 - 15.0 g/dL Final  .  HCT 01/25/2020 40.8  36.0 - 46.0 % Final  . MCV 01/25/2020 100.5* 80.0 - 100.0 fL Final  . MCH 01/25/2020 33.5  26.0 - 34.0 pg Final  . MCHC 01/25/2020 33.3  30.0 - 36.0 g/dL Final  . RDW 01/25/2020 12.6  11.5 - 15.5 % Final  . Platelets 01/25/2020 230  150 - 400 K/uL Final  . nRBC 01/25/2020 0.0  0.0 - 0.2 % Final  . Neutrophils Relative % 01/25/2020 52  % Final  . Neutro Abs 01/25/2020 1.9  1.7 - 7.7 K/uL Final  . Lymphocytes Relative 01/25/2020 33  % Final  . Lymphs Abs 01/25/2020 1.2  0.7 - 4.0 K/uL Final  . Monocytes Relative 01/25/2020 10  % Final  . Monocytes Absolute 01/25/2020 0.4  0.1 - 1.0 K/uL Final  . Eosinophils Relative 01/25/2020 3  % Final  . Eosinophils Absolute 01/25/2020 0.1  0.0 - 0.5 K/uL Final  . Basophils Relative 01/25/2020 2  % Final  . Basophils Absolute 01/25/2020 0.1  0.0 - 0.1 K/uL Final  . Immature Granulocytes 01/25/2020 0  % Final  . Abs Immature Granulocytes 01/25/2020 0.01  0.00 - 0.07 K/uL Final   Performed at [facility], [address] [place], [place] [address]    Assessment:  Alfaretta Schoenecker is a 64 y.o. female with  macrocytosis.  Diet is good.  She denies liver disease.  She drinks minimal alcohol.  She was on oral B12 x 4 weeks.  CBC on 12/13/2017 revealed a hematocrit of 41.7, hemoglobin 13.4, MCV 103, platelets 264,000, WBC 5700 with an ANC of 3500.  Differential was unremarkable.  TSH was normal.  B12 was 385.    Work-up on 01/16/2018 revealed a hematocrit of 38.7, hemoglobin 13.1, MCV 103.2, platelets 284,000, WBC 6400 with an ANC of 5000.  Retic was 1.7%.  Free T4, MMA , folate, and copper were normal.  Peripheral smear revealed only macrocytosis.  Her mother has polycythemia rubra vera (PV) and is on anagrelide.  Patient received her last COVID-19 vaccine on 12/25/2019  Symptomatically, some fatigue.  Exam is stable.  Plan: 1.   Labs today: CBC with diff, B12, folate, TSH. 2.   Macrocytosis             Hematocrit 40.8.  Hemoglobin 13.6.  MCV 100.5.             Microcytosis appears to be improving.             She has no known liver disease.             Doubt patient has MDS given improvement in macrocytosis.             Continue surveillance. 3.   RTC in 1 year for MD assessment and labs (CBC with diff, CMP and +/- others).    Addendum: B12 level today was 338.  B12 goal is 400.  Patient was contacted regarding initiation of B12 at 1000 mcg a day.  Recheck B12 level in 1 month.    I discussed the assessment and treatment plan with the patient.  The patient was provided an opportunity to ask questions and all were answered.  The patient agreed with the plan and demonstrated an understanding of the instructions.  The patient was advised to call back if the symptoms worsen or if the condition fails to improve as anticipated.  I provided 15 minutes of face-to-face time during this this encounter and > 50% was spent  counseling as documented under my assessment and plan.    Lequita Asal, MD, PhD    01/25/2020, 11:12 AM  I, Selena Batten, am acting as scribe for [employer]. Mike Gip,  MD, PhD.  I, Shaquita Fort C. Mike Gip, MD, have reviewed the above documentation for accuracy and completeness, and I agree with the above.

## 2020-01-25 ENCOUNTER — Inpatient Hospital Stay: Payer: 59

## 2020-01-25 ENCOUNTER — Encounter: Payer: Self-pay | Admitting: Hematology and Oncology

## 2020-01-25 ENCOUNTER — Other Ambulatory Visit: Payer: Self-pay

## 2020-01-25 ENCOUNTER — Inpatient Hospital Stay: Payer: 59 | Admitting: Hematology and Oncology

## 2020-01-25 VITALS — BP 139/79 | HR 72 | Temp 97.7°F | Resp 18 | Wt 138.9 lb

## 2020-01-25 DIAGNOSIS — D7589 Other specified diseases of blood and blood-forming organs: Secondary | ICD-10-CM

## 2020-01-25 DIAGNOSIS — E538 Deficiency of other specified B group vitamins: Secondary | ICD-10-CM | POA: Diagnosis not present

## 2020-01-25 LAB — CBC WITH DIFFERENTIAL/PLATELET
Abs Immature Granulocytes: 0.01 10*3/uL (ref 0.00–0.07)
Basophils Absolute: 0.1 10*3/uL (ref 0.0–0.1)
Basophils Relative: 2 %
Eosinophils Absolute: 0.1 10*3/uL (ref 0.0–0.5)
Eosinophils Relative: 3 %
HCT: 40.8 % (ref 36.0–46.0)
Hemoglobin: 13.6 g/dL (ref 12.0–15.0)
Immature Granulocytes: 0 %
Lymphocytes Relative: 33 %
Lymphs Abs: 1.2 10*3/uL (ref 0.7–4.0)
MCH: 33.5 pg (ref 26.0–34.0)
MCHC: 33.3 g/dL (ref 30.0–36.0)
MCV: 100.5 fL — ABNORMAL HIGH (ref 80.0–100.0)
Monocytes Absolute: 0.4 10*3/uL (ref 0.1–1.0)
Monocytes Relative: 10 %
Neutro Abs: 1.9 10*3/uL (ref 1.7–7.7)
Neutrophils Relative %: 52 %
Platelets: 230 10*3/uL (ref 150–400)
RBC: 4.06 MIL/uL (ref 3.87–5.11)
RDW: 12.6 % (ref 11.5–15.5)
WBC: 3.6 10*3/uL — ABNORMAL LOW (ref 4.0–10.5)
nRBC: 0 % (ref 0.0–0.2)

## 2020-01-25 LAB — TSH: TSH: 1.547 u[IU]/mL (ref 0.350–4.500)

## 2020-01-25 LAB — FOLATE: Folate: 29 ng/mL (ref >5.9)

## 2020-01-25 LAB — VITAMIN B12: Vitamin B-12: 338 pg/mL (ref 180–914)

## 2020-01-25 NOTE — Progress Notes (Signed)
Patient states that she is tired a lot.

## 2020-01-28 ENCOUNTER — Telehealth: Payer: Self-pay

## 2020-01-28 NOTE — Telephone Encounter (Signed)
Spoke with Heather Browning to inform her that, Per Dr Mike Gip her B-12 inlow at 338 and her ideal level 400, continue oral medication at this time and recheck levels in 1 month. Appointment has been scheduled. The patient was understanding and agreeable.

## 2020-02-23 ENCOUNTER — Other Ambulatory Visit: Payer: Self-pay

## 2020-02-23 DIAGNOSIS — E538 Deficiency of other specified B group vitamins: Secondary | ICD-10-CM

## 2020-02-26 ENCOUNTER — Inpatient Hospital Stay: Payer: 59 | Attending: Hematology and Oncology

## 2020-02-26 ENCOUNTER — Other Ambulatory Visit: Payer: Self-pay

## 2020-02-26 DIAGNOSIS — E538 Deficiency of other specified B group vitamins: Secondary | ICD-10-CM | POA: Diagnosis not present

## 2020-02-26 LAB — VITAMIN B12: Vitamin B-12: 282 pg/mL (ref 180–914)

## 2020-02-29 ENCOUNTER — Telehealth: Payer: Self-pay

## 2020-02-29 DIAGNOSIS — E538 Deficiency of other specified B group vitamins: Secondary | ICD-10-CM

## 2020-02-29 NOTE — Telephone Encounter (Signed)
Spoke with the patient to ensure she is taking oral b-12. The stats yes she is taking oral b-12 1000 mcg daily. i have informed her that her goal is 400 and Dr Mike Gip will recheck b-12 levels in 1 month. if still low will start B-12 injection. The patient was understanding and agreeable.

## 2020-02-29 NOTE — Telephone Encounter (Signed)
-----   Message from Lequita Asal, MD sent at 02/27/2020  5:58 AM EDT ----- Regarding: Please call patient.  B12 is low.  B12 is low.  B12 goal 400.  Ensure she is taking oral B12.  Recheck in 1 month.  If still low, begin B12 injections.  M ----- Message ----- From: Buel Ream, Lab In [place] Sent: 02/26/2020   4:57 PM EDT To: Lequita Asal, MD

## 2020-02-29 NOTE — Telephone Encounter (Signed)
-----   Message from Lequita Asal, MD sent at 02/27/2020  5:58 AM EDT ----- Regarding: Please call patient.  B12 is low.  B12 is low.  B12 goal 400.  Ensure she is taking oral B12.  Recheck in 1 month.  If still low, begin B12 injections.  M ----- Message ----- From: Buel Ream, Lab In [place] Sent: 02/26/2020   4:57 PM EDT To: Lequita Asal, MD

## 2020-03-31 ENCOUNTER — Other Ambulatory Visit: Payer: Self-pay

## 2020-03-31 ENCOUNTER — Inpatient Hospital Stay: Payer: 59 | Attending: Hematology and Oncology

## 2020-03-31 DIAGNOSIS — E538 Deficiency of other specified B group vitamins: Secondary | ICD-10-CM | POA: Diagnosis present

## 2020-03-31 LAB — VITAMIN B12: Vitamin B-12: 443 pg/mL (ref 180–914)

## 2021-01-19 NOTE — Progress Notes (Signed)
[facility]  [address], [address] [place], [place] [address] Phone: [phone]  Fax: [phone]   Clinic Day:  01/23/2021  Referring physician: Roselee Culver, MD  Chief Complaint: Heather Browning is a 65 y.o. 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   HPI: The patient was last seen in the hematology clinic on 01/25/2020. At that time, she noted some fatigue.  Exam was stable. Hematocrit was 40.8, hemoglobin 13.6, MCV 100.5, platelets 230,000, WBC 3,600. Vitamin B12 was 338 (sligthyl low) and folate 29.0. TSH was 1.547. She was to begin oral B12 1000 mcg daily. We discussed continued surveillance.  Vitamin B12 was 282 on 02/26/2020 and 443 on 03/31/2020.  During the interim, she has been pretty good. Her lower legs stay sore. 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  She takes vitamin B12 1000 mcg every other day.   Past Medical History:  Diagnosis Date  . Shingles   . Skin cancer   . Vitamin D deficiency 12/25/2017    Past Surgical History:  Procedure Laterality Date  . ABDOMINAL HYSTERECTOMY     pt has one ovary left  . TONSILLECTOMY      Family History  Problem Relation Age of Onset  . Polycythemia Mother   . Heart disease Father   . Heart disease Maternal Grandmother   . Heart disease Maternal Grandfather   . Heart disease Paternal Grandfather     Social History:  reports that she has never smoked. She has never used smokeless tobacco. She reports current alcohol use. She reports that she does not use drugs. Patient uses very little alcohol. She has never smoked. Patient is originally from [place], [place]. She lives in [place], [place] with her husband. Patient has 3 adult children. 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 The patient is alone today.  Allergies:  Allergies  Allergen Reactions  . Macrobid [Nitrofurantoin Macrocrystal] Hives  . Morphine And  Related Hives  . Nitrofurantoin Hives    Current Medications: Current Outpatient Medications  Medication Sig Dispense Refill  . valACYclovir (VALTREX) 1000 MG tablet TAKE 1 TABLET BY MOUTH ONCE DAILY 30 tablet 0  . Multiple Vitamins-Minerals (MULTIVITAMIN GUMMIES WOMENS PO) Take by mouth daily. (Patient not taking: Reported on 01/23/2021)    . vitamin B-12 (CYANOCOBALAMIN) 500 MCG tablet Take 1,000 mcg by mouth daily. (Patient not taking: Reported on 01/23/2021)     No current facility-administered medications for this visit.    Review of Systems  Constitutional: Negative for chills, diaphoresis, fever, malaise/fatigue and weight loss (up 5 lbs).       Feels "pretty good".  HENT: Negative for congestion, ear discharge, ear pain, hearing loss, nosebleeds, sinus pain and sore throat.   Eyes: Negative for blurred vision.  Respiratory: Negative for cough, hemoptysis, sputum production and shortness of breath.   Cardiovascular: Negative for chest pain, palpitations and leg swelling.  Gastrointestinal: Negative for abdominal pain, blood in stool, constipation, diarrhea, heartburn, melena, nausea and vomiting.  Genitourinary: Negative for dysuria, frequency, hematuria and urgency.  Musculoskeletal: Positive for myalgias (BLE soreness). Negative for back pain, joint pain and neck pain.  Skin: Negative for itching and rash.  Neurological: Negative for dizziness, tingling, sensory change, weakness and headaches.  Endo/Heme/Allergies: Does not bruise/bleed easily.  Psychiatric/Behavioral: Negative for depression and memory loss. The patient is not nervous/anxious and does not have insomnia.   All other  systems reviewed and are negative.  Performance status (ECOG): 1  Vitals Blood pressure 131/76, pulse 80, temperature 98.1 F (36.7 C), temperature source Tympanic, weight 143 lb 3 oz (64.9 kg), SpO2 100 %.   Physical Exam Vitals and nursing note reviewed.  Constitutional:      General: She is  not in acute distress.    Appearance: She is well-developed. She is not diaphoretic.  HENT:     Head: Normocephalic and atraumatic.     Mouth/Throat:     Pharynx: No oropharyngeal exudate.  Eyes:     General: No scleral icterus.    Conjunctiva/sclera: Conjunctivae normal.     Pupils: Pupils are equal, round, and reactive to light.     Comments: Glasses.  Neck:     Vascular: No JVD.  Cardiovascular:     Rate and Rhythm: Normal rate and regular rhythm.     Heart sounds: Normal heart sounds. No murmur heard.   Pulmonary:     Effort: Pulmonary effort is normal. No respiratory distress.     Breath sounds: Normal breath sounds. No wheezing or rales.  Chest:     Chest wall: No tenderness.  Breasts:     Right: No supraclavicular adenopathy.     Left: No supraclavicular adenopathy.    Abdominal:     General: Bowel sounds are normal. There is no distension.     Palpations: Abdomen is soft. There is no mass.     Tenderness: There is no abdominal tenderness. There is no guarding or rebound.  Musculoskeletal:        General: Tenderness (BLE) present. Normal range of motion.     Cervical back: Normal range of motion and neck supple.  Lymphadenopathy:     Cervical: No cervical adenopathy.     Upper Body:     Right upper body: No supraclavicular adenopathy.     Left upper body: No supraclavicular adenopathy.  Skin:    General: Skin is warm and dry.  Neurological:     Mental Status: She is alert and oriented to person, place, and time.  Psychiatric:        Behavior: Behavior normal.        Thought Content: Thought content normal.        Judgment: Judgment normal.    Appointment on 01/23/2021  Component Date Value Ref Range Status  . Sodium 01/23/2021 135  135 - 145 mmol/L Final  . Potassium 01/23/2021 3.5  3.5 - 5.1 mmol/L Final  . Chloride 01/23/2021 101  98 - 111 mmol/L Final  . CO2 01/23/2021 28  22 - 32 mmol/L Final  . Glucose, Bld 01/23/2021 151* 70 - 99 mg/dL Final    Glucose reference range applies only to samples taken after fasting for at least 8 hours.  . BUN 01/23/2021 13  8 - 23 mg/dL Final  . Creatinine, Ser 01/23/2021 0.69  0.44 - 1.00 mg/dL Final  . Calcium 01/23/2021 8.9  8.9 - 10.3 mg/dL Final  . Total Protein 01/23/2021 6.8  6.5 - 8.1 g/dL Final  . Albumin 01/23/2021 4.2  3.5 - 5.0 g/dL Final  . AST 01/23/2021 19  15 - 41 U/L Final  . ALT 01/23/2021 12  0 - 44 U/L Final  . Alkaline Phosphatase 01/23/2021 63  38 - 126 U/L Final  . Total Bilirubin 01/23/2021 0.6  0.3 - 1.2 mg/dL Final  . GFR, Estimated 01/23/2021 >60  >60 mL/min Final   Comment: (NOTE) Calculated using the CKD-EPI  Creatinine Equation (2021)   . Anion gap 01/23/2021 6  5 - 15 Final   Performed at [facility], [address] [place], [place] [address]  . WBC 01/23/2021 5.6  4.0 - 10.5 K/uL Final  . RBC 01/23/2021 4.08  3.87 - 5.11 MIL/uL Final  . Hemoglobin 01/23/2021 13.2  12.0 - 15.0 g/dL Final  . HCT 01/23/2021 40.0  36.0 - 46.0 % Final  . MCV 01/23/2021 98.0  80.0 - 100.0 fL Final  . MCH 01/23/2021 32.4  26.0 - 34.0 pg Final  . MCHC 01/23/2021 33.0  30.0 - 36.0 g/dL Final  . RDW 01/23/2021 12.3  11.5 - 15.5 % Final  . Platelets 01/23/2021 243  150 - 400 K/uL Final  . nRBC 01/23/2021 0.0  0.0 - 0.2 % Final  . Neutrophils Relative % 01/23/2021 63  % Final  . Neutro Abs 01/23/2021 3.5  1.7 - 7.7 K/uL Final  . Lymphocytes Relative 01/23/2021 27  % Final  . Lymphs Abs 01/23/2021 1.5  0.7 - 4.0 K/uL Final  . Monocytes Relative 01/23/2021 7  % Final  . Monocytes Absolute 01/23/2021 0.4  0.1 - 1.0 K/uL Final  . Eosinophils Relative 01/23/2021 1  % Final  . Eosinophils Absolute 01/23/2021 0.1  0.0 - 0.5 K/uL Final  . Basophils Relative 01/23/2021 1  % Final  . Basophils Absolute 01/23/2021 0.1  0.0 - 0.1 K/uL Final  . Immature Granulocytes 01/23/2021 1  % Final  . Abs Immature Granulocytes 01/23/2021 0.03  0.00 - 0.07 K/uL Final   Performed at [facility], [address] [place], [place] [address]    Assessment:  Breslyn Abdo is a 65 y.o. 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  Diet is good.  She denies liver disease.  She drinks minimal alcohol.  She was on oral B12 x 4 weeks.  CBC on 12/13/2017 revealed a hematocrit of 41.7, hemoglobin 13.4, MCV 103, platelets 264,000, WBC 5700 with an ANC of 3500.  Differential was unremarkable.  TSH was normal.  B12 was 385.    Work-up on 01/16/2018 revealed a hematocrit of 38.7, hemoglobin 13.1, MCV 103.2, platelets 284,000, WBC 6400 with an ANC of 5000.  Retic was 1.7%.  Free T4, MMA , folate, and copper were normal.  Peripheral smear revealed only macrocytosis.  She has B12 deficiency.  B12 was 282 on 02/26/2020 and 443 on 03/31/2020.  She is on oral B12.  Her mother has polycythemia rubra vera (PV) and is on anagrelide.  Patient received her last COVID-19 vaccine on 12/25/2019  Symptomatically, she feels pretty good. Her lower legs stay sore.  Exam is stable.  Plan: 1.   Labs today: CBC with diff, CMP, B12, folate. 2.   Macrocytosis, resolved             Hematocrit 40.0.  Hemoglobin 13.2.  MCV 98.0.             RBC indices have normalized.             Discuss plan for follow-up as needed- patient agrees. 3.   B12 deficiency  Patient on B12 1000 mcg po QOD.  RN:  Call patient with B12 level. 4.   RTC prn.  Addendum:  B12 level is 251 (low) .  B12 goal is 400.  Increase B12 to every day and recheck level in 1 month.  I discussed the assessment and treatment plan with the patient.  The patient was provided an opportunity  to ask questions and all were answered.  The patient agreed with the plan and demonstrated an understanding of the instructions.  The patient was advised to call back if the symptoms worsen or if the condition fails to improve as anticipated.   Heather Asal, MD, PhD    01/23/2021, 2:24 PM  I, Mirian Mo Tufford, am acting as Education administrator for [employer]. Mike Gip, MD, PhD.  I,  Satina Jerrell C. Mike Gip, MD, have reviewed the above documentation for accuracy and completeness, and I agree with the above.

## 2021-01-21 ENCOUNTER — Other Ambulatory Visit: Payer: Self-pay | Admitting: Hematology and Oncology

## 2021-01-21 DIAGNOSIS — E538 Deficiency of other specified B group vitamins: Secondary | ICD-10-CM

## 2021-01-23 ENCOUNTER — Other Ambulatory Visit: Payer: Self-pay

## 2021-01-23 ENCOUNTER — Ambulatory Visit: Payer: 59 | Admitting: Hematology and Oncology

## 2021-01-23 ENCOUNTER — Inpatient Hospital Stay: Payer: 59

## 2021-01-23 ENCOUNTER — Other Ambulatory Visit: Payer: 59

## 2021-01-23 ENCOUNTER — Inpatient Hospital Stay: Payer: 59 | Attending: Hematology and Oncology | Admitting: Hematology and Oncology

## 2021-01-23 ENCOUNTER — Encounter: Payer: Self-pay | Admitting: Hematology and Oncology

## 2021-01-23 VITALS — BP 131/76 | HR 80 | Temp 98.1°F | Wt 143.2 lb

## 2021-01-23 DIAGNOSIS — Z832 Family history of diseases of the blood and blood-forming organs and certain disorders involving the immune mechanism: Secondary | ICD-10-CM | POA: Diagnosis not present

## 2021-01-23 DIAGNOSIS — D7589 Other specified diseases of blood and blood-forming organs: Secondary | ICD-10-CM | POA: Insufficient documentation

## 2021-01-23 DIAGNOSIS — E538 Deficiency of other specified B group vitamins: Secondary | ICD-10-CM

## 2021-01-23 DIAGNOSIS — Z8249 Family history of ischemic heart disease and other diseases of the circulatory system: Secondary | ICD-10-CM | POA: Insufficient documentation

## 2021-01-23 DIAGNOSIS — R5383 Other fatigue: Secondary | ICD-10-CM | POA: Insufficient documentation

## 2021-01-23 DIAGNOSIS — Z881 Allergy status to other antibiotic agents status: Secondary | ICD-10-CM | POA: Diagnosis not present

## 2021-01-23 DIAGNOSIS — Z79899 Other long term (current) drug therapy: Secondary | ICD-10-CM | POA: Diagnosis not present

## 2021-01-23 DIAGNOSIS — Z885 Allergy status to narcotic agent status: Secondary | ICD-10-CM | POA: Insufficient documentation

## 2021-01-23 DIAGNOSIS — M791 Myalgia, unspecified site: Secondary | ICD-10-CM | POA: Diagnosis not present

## 2021-01-23 LAB — CBC WITH DIFFERENTIAL/PLATELET
Abs Immature Granulocytes: 0.03 10*3/uL (ref 0.00–0.07)
Basophils Absolute: 0.1 10*3/uL (ref 0.0–0.1)
Basophils Relative: 1 %
Eosinophils Absolute: 0.1 10*3/uL (ref 0.0–0.5)
Eosinophils Relative: 1 %
HCT: 40 % (ref 36.0–46.0)
Hemoglobin: 13.2 g/dL (ref 12.0–15.0)
Immature Granulocytes: 1 %
Lymphocytes Relative: 27 %
Lymphs Abs: 1.5 10*3/uL (ref 0.7–4.0)
MCH: 32.4 pg (ref 26.0–34.0)
MCHC: 33 g/dL (ref 30.0–36.0)
MCV: 98 fL (ref 80.0–100.0)
Monocytes Absolute: 0.4 10*3/uL (ref 0.1–1.0)
Monocytes Relative: 7 %
Neutro Abs: 3.5 10*3/uL (ref 1.7–7.7)
Neutrophils Relative %: 63 %
Platelets: 243 10*3/uL (ref 150–400)
RBC: 4.08 MIL/uL (ref 3.87–5.11)
RDW: 12.3 % (ref 11.5–15.5)
WBC: 5.6 10*3/uL (ref 4.0–10.5)
nRBC: 0 % (ref 0.0–0.2)

## 2021-01-23 LAB — COMPREHENSIVE METABOLIC PANEL
ALT: 12 U/L (ref 0–44)
AST: 19 U/L (ref 15–41)
Albumin: 4.2 g/dL (ref 3.5–5.0)
Alkaline Phosphatase: 63 U/L (ref 38–126)
Anion gap: 6 (ref 5–15)
BUN: 13 mg/dL (ref 8–23)
CO2: 28 mmol/L (ref 22–32)
Calcium: 8.9 mg/dL (ref 8.9–10.3)
Chloride: 101 mmol/L (ref 98–111)
Creatinine, Ser: 0.69 mg/dL (ref 0.44–1.00)
GFR, Estimated: >60 mL/min (ref >60)
Glucose, Bld: 151 mg/dL — ABNORMAL HIGH (ref 70–99)
Potassium: 3.5 mmol/L (ref 3.5–5.1)
Sodium: 135 mmol/L (ref 135–145)
Total Bilirubin: 0.6 mg/dL (ref 0.3–1.2)
Total Protein: 6.8 g/dL (ref 6.5–8.1)

## 2021-01-23 LAB — VITAMIN B12: Vitamin B-12: 251 pg/mL (ref 180–914)

## 2021-01-23 LAB — FOLATE: Folate: 17.6 ng/mL (ref >5.9)

## 2021-01-25 ENCOUNTER — Telehealth: Payer: Self-pay

## 2021-01-25 DIAGNOSIS — E538 Deficiency of other specified B group vitamins: Secondary | ICD-10-CM

## 2021-01-25 DIAGNOSIS — D7589 Other specified diseases of blood and blood-forming organs: Secondary | ICD-10-CM

## 2021-01-25 NOTE — Telephone Encounter (Signed)
-----   Message from Lequita Asal, MD sent at 01/24/2021 10:53 AM EDT ----- Regarding: Please call patient  B12 is low.  She needs to take daily.  Check B12 level in 1-2 months to ensure B12 > = 400.  M  ----- Message ----- From: Buel Ream, Lab In [place] Sent: 01/23/2021   1:39 PM EDT To: Lequita Asal, MD

## 2021-01-25 NOTE — Telephone Encounter (Signed)
Left VM notifying pt and also sent Mychart message.

## 2021-01-25 NOTE — Telephone Encounter (Signed)
01/25/2021 Appt for labs made for 5/18 @ 1:15. Updated AVS printed, will give to pt when she is here for her mother's appt today SRW

## 2021-03-07 ENCOUNTER — Other Ambulatory Visit: Payer: Self-pay | Admitting: Nurse Practitioner

## 2021-03-07 ENCOUNTER — Inpatient Hospital Stay: Payer: 59 | Attending: Nurse Practitioner

## 2021-03-07 ENCOUNTER — Other Ambulatory Visit: Payer: Self-pay

## 2021-03-07 DIAGNOSIS — E538 Deficiency of other specified B group vitamins: Secondary | ICD-10-CM | POA: Diagnosis present

## 2021-03-07 LAB — VITAMIN B12: Vitamin B-12: 1150 pg/mL — ABNORMAL HIGH (ref 180–914)

## 2021-03-08 ENCOUNTER — Other Ambulatory Visit: Payer: 59

## 2021-03-21 DIAGNOSIS — R509 Fever, unspecified: Secondary | ICD-10-CM | POA: Insufficient documentation

## 2021-04-27 DIAGNOSIS — F419 Anxiety disorder, unspecified: Secondary | ICD-10-CM | POA: Insufficient documentation

## 2021-07-18 DIAGNOSIS — Z124 Encounter for screening for malignant neoplasm of cervix: Secondary | ICD-10-CM | POA: Insufficient documentation

## 2022-01-01 ENCOUNTER — Ambulatory Visit
Admission: EM | Admit: 2022-01-01 | Discharge: 2022-01-01 | Disposition: A | Discharge status: Home / Self Care | Payer: BC Managed Care – PPO | Attending: Physician Assistant | Admitting: Physician Assistant

## 2022-01-01 ENCOUNTER — Other Ambulatory Visit: Payer: Self-pay

## 2022-01-01 DIAGNOSIS — N760 Acute vaginitis: Secondary | ICD-10-CM | POA: Diagnosis present

## 2022-01-01 DIAGNOSIS — B9689 Other specified bacterial agents as the cause of diseases classified elsewhere: Secondary | ICD-10-CM

## 2022-01-01 LAB — URINALYSIS, ROUTINE W REFLEX MICROSCOPIC
Bilirubin Urine: NEGATIVE
Glucose, UA: NEGATIVE mg/dL
Hgb urine dipstick: NEGATIVE
Ketones, ur: NEGATIVE mg/dL
Nitrite: NEGATIVE
Protein, ur: NEGATIVE mg/dL
Specific Gravity, Urine: 1.01 (ref 1.005–1.030)
pH: 7 (ref 5.0–8.0)

## 2022-01-01 LAB — URINALYSIS, MICROSCOPIC (REFLEX): WBC, UA: >50 WBC/hpf (ref 0–5)

## 2022-01-01 LAB — WET PREP, GENITAL
Sperm: NONE SEEN
Trich, Wet Prep: NONE SEEN
WBC, Wet Prep HPF POC: >10 — AB (ref <10)
Yeast Wet Prep HPF POC: NONE SEEN

## 2022-01-01 MED ORDER — KETOROLAC TROMETHAMINE 60 MG/2ML IM SOLN
30.0000 mg | Freq: Once | INTRAMUSCULAR | Status: AC
  Administered 2022-01-01: 30 mg via INTRAMUSCULAR

## 2022-01-01 MED ORDER — METRONIDAZOLE 500 MG PO TABS: 500.0000 mg | ORAL_TABLET | Freq: Two times a day (BID) | ORAL | 0 refills | Status: DC

## 2022-01-01 NOTE — ED Triage Notes (Signed)
Pt with urinary urgency and hesitancy starting this am ?

## 2022-01-01 NOTE — Discharge Instructions (Signed)
Today you are being treated  for  Bacterial vaginosis  ? ?Urinalysis was positive for Heather Browning blood cells and bacteria under microscope but negative for nitrates which we use to indicate urinary infections ? ?Your vaginal swab was positive for bacterial vaginosis, negative for yeast and trichomoniasis ? ?Take Metronidazole 500 mg twice a day for 7 days, do not drink alcohol while using medication, this will make you feel sick  ? ?Bacterial vaginosis which results from an overgrowth of one on several organisms that are normally present in your vagina. Vaginosis is an inflammation of the vagina that can result in discharge, itching and pain ? ? ?In addition: ?Avoid baths, hot tubs and whirlpool spas.  ?Don't use scented or harsh soaps ?Avoid irritants. These include scented tampons and pads. ?Wipe from front to back after using the toilet. ?Don't douche. Your vagina doesn't require cleansing other than normal bathing.  ?Use a condom.  ?Wear cotton underwear, this fabric absorbs some moisture.  ? ? ?  ?

## 2022-01-01 NOTE — ED Provider Notes (Signed)
?[place] ? ? ? ?CSN: [id number] ?Arrival date & time: 01/01/22  1002 ? ? ?  ? ?History   ?Chief Complaint ?Chief Complaint  ?Patient presents with  ? Urinary Urgency  ? ? ?HPI ?Heather Browning is a 66 y.o. female.  ? ?Patient presents with urinary frequency, urgency, dysuria and lower abdominal pressure for 3 days.  Has not attempted treatment of symptoms.  Denies hematuria, flank pain, fever, chills, vaginal discharge, itching or odor. ? ?Past Medical History:  ?Diagnosis Date  ? Shingles   ? Skin cancer   ? Vitamin D deficiency 12/25/2017  ? ? ?Patient Active Problem List  ? Diagnosis Date Noted  ? Low vitamin B12 level 01/18/2018  ? 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 Macrocytosis 12/25/2017  ? Vitamin D deficiency 12/25/2017  ? Zoster with other complications 69/62/9528  ? ? ?Past Surgical History:  ?Procedure Laterality Date  ? ABDOMINAL HYSTERECTOMY    ? pt has one ovary left  ? TONSILLECTOMY    ? ? ?OB History   ?No obstetric history on file. ?  ? ? ? ?Home Medications   ? ?Prior to Admission medications   ?Medication Sig Start Date End Date Taking? Authorizing Provider  ?Multiple Vitamins-Minerals (MULTIVITAMIN GUMMIES WOMENS PO) Take by mouth daily. ?Patient not taking: Reported on 01/23/2021    [provider]  ?valACYclovir (VALTREX) 1000 MG tablet TAKE 1 TABLET BY MOUTH ONCE DAILY 12/15/18   Kathrine Haddock, NP  ?vitamin B-12 (CYANOCOBALAMIN) 500 MCG tablet Take 1,000 mcg by mouth daily. ?Patient not taking: Reported on 01/23/2021    [provider]  ? ? ?Family History ?Family History  ?Problem Relation Age of Onset  ? Polycythemia Mother   ? Heart disease Father   ? Heart disease Maternal Grandmother   ? Heart disease Maternal Grandfather   ? Heart disease Paternal Grandfather   ? ? ?Social History ?Social History  ? ?Tobacco Use  ? Smoking status: Never  ?  Passive exposure: Never  ? Smokeless tobacco: Never  ?Vaping Use  ? Vaping Use: Never used  ?Substance Use Topics  ? Alcohol use: Yes   ?  Comment: once a month/ wine  ? Drug use: No  ? ? ? ?Allergies   ?Macrobid [nitrofurantoin macrocrystal] and Morphine and related ? ? ?Review of Systems ?Review of Systems  ?Constitutional: Negative.   ?Respiratory: Negative.    ?Gastrointestinal:  Positive for abdominal pain. Negative for abdominal distention, anal bleeding, blood in stool, constipation, diarrhea, nausea, rectal pain and vomiting.  ?Genitourinary:  Positive for dysuria, frequency and urgency. Negative for decreased urine volume, difficulty urinating, dyspareunia, enuresis, flank pain, genital sores, hematuria, menstrual problem, pelvic pain, vaginal bleeding, vaginal discharge and vaginal pain.  ?Skin: Negative.   ? ? ?Physical Exam ?Triage Vital Signs ?ED Triage Vitals  ?Enc Vitals Group  ?   BP 01/01/22 1044 129/74  ?   Pulse Rate 01/01/22 1044 80  ?   Resp 01/01/22 1044 18  ?   Temp 01/01/22 1044 98.2 ?F (36.8 ?C)  ?   Temp Source 01/01/22 1044 Oral  ?   SpO2 01/01/22 1044 100 %  ?   Weight 01/01/22 1040 145 lb (65.8 kg)  ?   Height 01/01/22 1040 '5\' 7"'$  (1.702 m)  ?   Head Circumference --   ?   Peak Flow --   ?   Pain Score 01/01/22 1040 8  ?   Pain Loc --   ?  Pain Edu? --   ?   Excl. in [place]? --   ? ?No data found. ? ?Updated Vital Signs ?BP 129/74 (BP Location: Left Arm)   Pulse 80   Temp 98.2 ?F (36.8 ?C) (Oral)   Resp 18   Ht '5\' 7"'$  (1.702 m)   Wt 145 lb (65.8 kg)   SpO2 100%   BMI 22.71 kg/m?  ? ?Visual Acuity ?Right Eye Distance:   ?Left Eye Distance:   ?Bilateral Distance:   ? ?Right Eye Near:   ?Left Eye Near:    ?Bilateral Near:    ? ?Physical Exam ?Constitutional:   ?   Appearance: Normal appearance.  ?HENT:  ?   Head: Normocephalic.  ?Eyes:  ?   Extraocular Movements: Extraocular movements intact.  ?Pulmonary:  ?   Effort: Pulmonary effort is normal.  ?Abdominal:  ?   General: Abdomen is flat. Bowel sounds are normal.  ?   Palpations: Abdomen is soft.  ?   Tenderness: There is abdominal tenderness in the suprapubic area.  There is right CVA tenderness. There is no left CVA tenderness.  ?Skin: ?   General: Skin is warm and dry.  ?Neurological:  ?   Mental Status: She is alert and oriented to person, place, and time. Mental status is at baseline.  ?Psychiatric:     ?   Mood and Affect: Mood normal.     ?   Behavior: Behavior normal.  ? ? ? ?UC Treatments / Results  ?Labs ?(all labs ordered are listed, but only abnormal results are displayed) ?Labs Reviewed  ?URINALYSIS, ROUTINE W REFLEX MICROSCOPIC  ? ? ?EKG ? ? ?Radiology ?No results found. ? ?Procedures ?Procedures (including critical care time) ? ?Medications Ordered in UC ?Medications - No data to display ? ?Initial Impression / Assessment and Plan / UC Course  ?I have reviewed the triage vital signs and the nursing notes. ? ?Pertinent labs & imaging results that were available during my care of the patient were reviewed by me and considered in my medical decision making (see chart for details). ? ?Bacterial vaginosis ? ?Urinalysis positive for [place]ales blood cells and bacteria, negative for nitrates, discussed with patient, wet prep positive for bacterial vaginosis, negative for yeast and trichomoniasis, discussed with patient, metronidazole 7-day course prescribed, advise discontinuation of any alcohol use while medication, advised Tylenol and Azo over-the-counter to further help manage pain, Toradol injection given in office today as patient is on the verge of tears from discomfort, recommended good feminine hygiene in addition and increase water intake, may follow-up with urgent care, gynecologist for reevaluation if symptoms continue to persist ?Final Clinical Impressions(s) / UC Diagnoses  ? ?Final diagnoses:  ?None  ? ?Discharge Instructions   ?None ?  ? ?ED Prescriptions   ?None ?  ? ?PDMP not reviewed this encounter. ?  ?Hans Eden, NP ?01/01/22 1143 ? ?

## 2022-01-17 ENCOUNTER — Other Ambulatory Visit: Payer: Self-pay

## 2022-01-17 ENCOUNTER — Ambulatory Visit
Admission: EM | Admit: 2022-01-17 | Discharge: 2022-01-17 | Disposition: A | Discharge status: Home / Self Care | Payer: BC Managed Care – PPO | Attending: Student | Admitting: Student

## 2022-01-17 ENCOUNTER — Ambulatory Visit (INDEPENDENT_AMBULATORY_CARE_PROVIDER_SITE_OTHER): Payer: BC Managed Care – PPO

## 2022-01-17 DIAGNOSIS — R079 Chest pain, unspecified: Secondary | ICD-10-CM

## 2022-01-17 NOTE — ED Triage Notes (Signed)
Patient is here for  "Chest Pain". This episode started "this morning". Sharp pain "was about a month ago". Not seen after that, seen prior [facility] UC visit. "I feel like I need to breath really deep and stretch out sternal area". No trauma. This pain was "sharp and behind left breast". No radiation of pain.  ?

## 2022-01-17 NOTE — Discharge Instructions (Signed)
Take a baby asa every day until seen by cardiology ?

## 2022-01-17 NOTE — ED Triage Notes (Signed)
Patient presents to Urgent Care with also complains of an episode of vertigo that happened 3-4 days ago. She reports increased stress dealing with her moms health. She is unsure if all these symptoms are related. She states her PCP shared that her chest pain was related to stress. Instructed her to return for evaluation if continued having chest pain. She states she failed to follow-up.  ? ?Denies dizziness or N/V.  ?

## 2022-01-19 NOTE — ED Provider Notes (Signed)
?[place] ? ? ? ?CSN: [id number] ?Arrival date & time: 01/17/22  1202 ? ? ?  ? ?History   ?Chief Complaint ?Chief Complaint  ?Patient presents with  ? Chest Pain  ? Shortness of Breath  ? ? ?HPI ?Heather Browning is a 66 y.o. female.  ? 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  Patient reports she has been under a lot of stress.  Patient reports that she discussed symptoms with her primary care physician who thought her symptoms were from stress.  Patient denies any increase symptoms with exertion.  Patient denies any radiation of symptoms to her arm or her neck she denies any shortness of breath patient has not had any fever or chills she has not had any cough.  Patient reports she decided to come in today to get an EKG. ? ?The history is provided by the patient. No language interpreter was used.  ?Chest Pain ?Pain location:  Substernal area ?Pain quality: aching   ?Pain severity:  Moderate ?Onset quality:  Gradual ?Duration:  1 month ?Timing:  Constant ?Progression:  Worsening ?Chronicity:  New ?Relieved by:  Nothing ?Worsened by:  Nothing ?Ineffective treatments:  None tried ?Associated symptoms: shortness of breath   ?Associated symptoms: no abdominal pain   ?Risk factors: no hypertension   ?Shortness of Breath ?Severity:  Moderate ?Associated symptoms: chest pain   ?Associated symptoms: no abdominal pain   ? ?Past Medical History:  ?Diagnosis Date  ? Shingles   ? Skin cancer   ? Vitamin D deficiency 12/25/2017  ? ? ?Patient Active Problem List  ? Diagnosis Date Noted  ? Pap smear for cervical cancer screening 07/18/2021  ? Anxiety 04/27/2021  ? Fever 03/21/2021  ? Acne 05/13/2019  ? Acute recurrent maxillary sinusitis 01/09/2019  ? Low vitamin B12 level 01/18/2018  ? 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 Macrocytosis 12/25/2017   ? Vitamin D deficiency 12/25/2017  ? Zoster with other complications 38/75/6433  ? ? ?Past Surgical History:  ?Procedure Laterality Date  ? ABDOMINAL HYSTERECTOMY    ? pt has one ovary left  ? TONSILLECTOMY    ? ? ?OB History   ?No obstetric history on file. ?  ? ? ? ?Home Medications   ? ?Prior to Admission medications   ?Medication Sig Start Date End Date Taking? Authorizing Provider  ?metroNIDAZOLE (FLAGYL) 500 MG tablet Take 1 tablet (500 mg total) by mouth 2 (two) times daily. 01/01/22   Hans Eden, NP  ?Multiple Vitamins-Minerals (MULTIVITAMIN GUMMIES WOMENS PO) Take by mouth daily. ?Patient not taking: Reported on 01/23/2021    [provider]  ?valACYclovir (VALTREX) 1000 MG tablet TAKE 1 TABLET BY MOUTH ONCE DAILY 12/15/18   Kathrine Haddock, NP  ?vitamin B-12 (CYANOCOBALAMIN) 500 MCG tablet Take 1,000 mcg by mouth daily. ?Patient not taking: Reported on 01/23/2021    [provider]  ? ? ?Family History ?Family History  ?Problem Relation Age of Onset  ? Polycythemia Mother   ? Heart disease Father   ? Heart disease Maternal Grandmother   ? Heart disease Maternal Grandfather   ? Heart disease Paternal Grandfather   ? ? ?Social History ?Social History  ? ?Tobacco Use  ? Smoking status: Never  ?  Passive exposure: Never  ? Smokeless tobacco: Never  ?  Vaping Use  ? Vaping Use: Never used  ?Substance Use Topics  ? Alcohol use: Yes  ?  Comment: once a month/ wine  ? Drug use: No  ? ? ? ?Allergies   ?Macrobid [nitrofurantoin macrocrystal] and Morphine and related ? ? ?Review of Systems ?Review of Systems  ?Respiratory:  Positive for shortness of breath.   ?Cardiovascular:  Positive for chest pain.  ?Gastrointestinal:  Negative for abdominal pain.  ?All other systems reviewed and are negative. ? ? ?Physical Exam ?Triage Vital Signs ?ED Triage Vitals  ?Enc Vitals Group  ?   BP 01/17/22 1236 135/87  ?   Pulse Rate 01/17/22 1236 93  ?   Resp 01/17/22 1236 18  ?   Temp 01/17/22 1236 98.7 ?F (37.1 ?C)   ?   Temp Source 01/17/22 1236 Oral  ?   SpO2 01/17/22 1236 98 %  ?   Weight 01/17/22 1237 145 lb (65.8 kg)  ?   Height 01/17/22 1237 '5\' 7"'$  (1.702 m)  ?   Head Circumference --   ?   Peak Flow --   ?   Pain Score 01/17/22 1236 5  ?   Pain Loc --   ?   Pain Edu? --   ?   Excl. in [place]? --   ? ?No data found. ? ?Updated Vital Signs ?BP 135/87 (BP Location: Left Arm)   Pulse 93   Temp 98.7 ?F (37.1 ?C) (Oral)   Resp 18   Ht '5\' 7"'$  (1.702 m)   Wt 65.8 kg   SpO2 98%   BMI 22.71 kg/m?  ? ?Visual Acuity ?Right Eye Distance:   ?Left Eye Distance:   ?Bilateral Distance:   ? ?Right Eye Near:   ?Left Eye Near:    ?Bilateral Near:    ? ?Physical Exam ?Vitals and nursing note reviewed.  ?Constitutional:   ?   Appearance: She is well-developed.  ?HENT:  ?   Head: Normocephalic.  ?Cardiovascular:  ?   Rate and Rhythm: Normal rate and regular rhythm.  ?   Heart sounds: Normal heart sounds.  ?Pulmonary:  ?   Effort: Pulmonary effort is normal.  ?Chest:  ?   Chest wall: No tenderness.  ?Abdominal:  ?   General: There is no distension.  ?   Palpations: Abdomen is soft.  ?Musculoskeletal:     ?   General: Normal range of motion.  ?   Cervical back: Normal range of motion and neck supple.  ?Skin: ?   General: Skin is warm.  ?Neurological:  ?   Mental Status: She is alert and oriented to person, place, and time.  ? ? ? ?UC Treatments / Results  ?Labs ?(all labs ordered are listed, but only abnormal results are displayed) ?Labs Reviewed - No data to display ? ?EKG ? ?MDM patient has a normal EKG normal sinus no ST elevation or ST changes chest x-ray is read by the radiologist as normal.  Not think patient is experiencing angina I think her symptoms are more consistent with skeletal pain. ?Radiology ?No results found. ? ?Procedures ?Procedures (including critical care time) ? ?Medications Ordered in UC ?Medications - No data to display ? ?Initial Impression / Assessment and Plan / UC Course  ?I have reviewed the triage vital signs and  the nursing notes. ? ?Pertinent labs & imaging results that were available during my care of the patient were reviewed by me and considered in my medical decision making (see chart for  details). ? ?  ? ? ?Final Clinical Impressions(s) / UC Diagnoses  ? ?Final diagnoses:  ?Nonspecific chest pain  ? ? ? ?Discharge Instructions   ? ?  ?Take a baby asa every day until seen by cardiology ? ? ?ED Prescriptions   ?None ?  ? ?PDMP not reviewed this encounter. ?An After Visit Summary was printed and given to the patient.  ?  ?Fransico Meadow, PA-C ?01/19/22 1524 ? ?

## 2022-01-29 ENCOUNTER — Ambulatory Visit: Payer: 59 | Admitting: Dermatology

## 2022-02-19 ENCOUNTER — Encounter: Payer: Self-pay | Admitting: Dermatology

## 2022-02-19 ENCOUNTER — Ambulatory Visit: Payer: BC Managed Care – PPO | Admitting: Dermatology

## 2022-02-19 DIAGNOSIS — L570 Actinic keratosis: Secondary | ICD-10-CM

## 2022-02-19 DIAGNOSIS — L821 Other seborrheic keratosis: Secondary | ICD-10-CM

## 2022-02-19 DIAGNOSIS — D485 Neoplasm of uncertain behavior of skin: Secondary | ICD-10-CM

## 2022-02-19 DIAGNOSIS — Z1283 Encounter for screening for malignant neoplasm of skin: Secondary | ICD-10-CM

## 2022-02-19 DIAGNOSIS — L578 Other skin changes due to chronic exposure to nonionizing radiation: Secondary | ICD-10-CM | POA: Diagnosis not present

## 2022-02-19 DIAGNOSIS — D225 Melanocytic nevi of trunk: Secondary | ICD-10-CM

## 2022-02-19 DIAGNOSIS — D2261 Melanocytic nevi of right upper limb, including shoulder: Secondary | ICD-10-CM

## 2022-02-19 DIAGNOSIS — L814 Other melanin hyperpigmentation: Secondary | ICD-10-CM

## 2022-02-19 DIAGNOSIS — L300 Nummular dermatitis: Secondary | ICD-10-CM

## 2022-02-19 DIAGNOSIS — Z85828 Personal history of other malignant neoplasm of skin: Secondary | ICD-10-CM | POA: Diagnosis not present

## 2022-02-19 DIAGNOSIS — D239 Other benign neoplasm of skin, unspecified: Secondary | ICD-10-CM

## 2022-02-19 HISTORY — DX: Other benign neoplasm of skin, unspecified: D23.9

## 2022-02-19 MED ORDER — MOMETASONE FUROATE 0.1 % EX CREA: TOPICAL_CREAM | CUTANEOUS | 2 refills | Status: DC

## 2022-02-19 NOTE — Patient Instructions (Addendum)
Cryotherapy Aftercare ? ?Wash gently with soap and water everyday.   ?Apply Vaseline and Band-Aid daily until healed.  ? ?Wound Care Instructions ? ?Cleanse wound gently with soap and water once a day then pat dry with clean gauze. Apply a thing coat of Petrolatum (petroleum jelly, "Vaseline") over the wound (unless you have an allergy to this). We recommend that you use a new, sterile tube of Vaseline. Do not pick or remove scabs. Do not remove the yellow or white "healing tissue" from the base of the wound. ? ?Cover the wound with fresh, clean, nonstick gauze and secure with paper tape. You may use Band-Aids in place of gauze and tape if the would is small enough, but would recommend trimming much of the tape off as there is often too much. Sometimes Band-Aids can irritate the skin. ? ?You should call the office for your biopsy report after 1 week if you have not already been contacted. ? ?If you experience any problems, such as abnormal amounts of bleeding, swelling, significant bruising, significant pain, or evidence of infection, please call the office immediately. ? ?FOR ADULT SURGERY PATIENTS: If you need something for pain relief you may take 1 extra strength Tylenol (acetaminophen) AND 2 Ibuprofen ('200mg'$  each) together every 4 hours as needed for pain. (do not take these if you are allergic to them or if you have a reason you should not take them.) Typically, you may only need pain medication for 1 to 3 days.  ? ? ?Seborrheic Keratosis ? ?What causes seborrheic keratoses? ?Seborrheic keratoses are harmless, common skin growths that first appear during adult life.  As time goes by, more growths appear.  Some people may develop a large number of them.  Seborrheic keratoses appear on both covered and uncovered body parts.  They are not caused by sunlight.  The tendency to develop seborrheic keratoses can be inherited.  They vary in color from skin-colored to gray, brown, or even black.  They can be either  smooth or have a rough, warty surface.   ?Seborrheic keratoses are superficial and look as if they were stuck on the skin.  Under the microscope this type of keratosis looks like layers upon layers of skin.  That is why at times the top layer may seem to fall off, but the rest of the growth remains and re-grows.   ? ?Treatment ?Seborrheic keratoses do not need to be treated, but can easily be removed in the office.  Seborrheic keratoses often cause symptoms when they rub on clothing or jewelry.  Lesions can be in the way of shaving.  If they become inflamed, they can cause itching, soreness, or burning.  Removal of a seborrheic keratosis can be accomplished by freezing, burning, or surgery. ?If any spot bleeds, scabs, or grows rapidly, please return to have it checked, as these can be an indication of a skin cancer. ? ? ?Melanoma ABCDEs ? ?Melanoma is the most dangerous type of skin cancer, and is the leading cause of death from skin disease.  You are more likely to develop melanoma if you: ?Have light-colored skin, light-colored eyes, or red or blond hair ?Spend a lot of time in the sun ?Tan regularly, either outdoors or in a tanning bed ?Have had blistering sunburns, especially during childhood ?Have a close family member who has had a melanoma ?Have atypical moles or large birthmarks ? ?Early detection of melanoma is key since treatment is typically straightforward and cure rates are extremely high if we  catch it early.  ? ?The first sign of melanoma is often a change in a mole or a new dark spot.  The ABCDE system is a way of remembering the signs of melanoma. ? ?A for asymmetry:  The two halves do not match. ?B for border:  The edges of the growth are irregular. ?C for color:  A mixture of colors are present instead of an even brown color. ?D for diameter:  Melanomas are usually (but not always) greater than 94m - the size of a pencil eraser. ?E for evolution:  The spot keeps changing in size, shape, and  color. ? ?Please check your skin once per month between visits. You can use a small mirror in front and a large mirror behind you to keep an eye on the back side or your body.  ? ?If you see any new or changing lesions before your next follow-up, please call to schedule a visit. ? ?Please continue daily skin protection including broad spectrum sunscreen SPF 30+ to sun-exposed areas, reapplying every 2 hours as needed when you're outdoors.  ? ?Staying in the shade or wearing long sleeves, sun glasses (UVA+UVB protection) and wide brim hats (4-inch brim around the entire circumference of the hat) are also recommended for sun protection.   ? ? ?If You Need Anything After Your Visit ? ?If you have any questions or concerns for your doctor, please call our main line at [phone] press option 4 to reach your doctor's medical assistant. If no one answers, please leave a voicemail as directed and we will return your call as soon as possible. Messages left after 4 pm will be answered the following business day.  ? ?You may also send [place] message via MyChart. We typically respond to MyChart messages within 1-2 business days. ? ?For prescription refills, please ask your pharmacy to contact our office. Our fax number is [phone] ? ?If you have an urgent issue when the clinic is closed that cannot wait until the next business day, you can page your doctor at the number below.   ? ?Please note that while we do our best to be available for urgent issues outside of office hours, we are not available 24/7.  ? ?If you have an urgent issue and are unable to reach [place] you may choose to seek medical care at your doctor's office, retail clinic, urgent care center, or emergency room. ? ?If you have a medical emergency, please immediately call 911 or go to the emergency department. ? ?Pager Numbers ? ?- Dr. KNehemiah Massed [phone]? ?- Dr. MLaurence Ferrari [phone]? ?- Dr. SNicole Kindred [phone]? ?In the event of inclement weather,  please call our main line at [phone] an update on the status of any delays or closures. ? ?Dermatology Medication Tips: ?Please keep the boxes that topical medications come in in order to help keep track of the instructions about where and how to use these. Pharmacies typically print the medication instructions only on the boxes and not directly on the medication tubes.  ? ?If your medication is too expensive, please contact our office at [phone] 4 or send [place] message through [facility]  ? ?We are unable to tell what your co-pay for medications will be in advance as this is different depending on your insurance coverage. However, we may be able to find a substitute medication at lower cost or fill out paperwork to get insurance to cover a needed medication.  ? ?If a  prior authorization is required to get your medication covered by your insurance company, please allow [place] 1-2 business days to complete this process. ? ?Drug prices often vary depending on where the prescription is filled and some pharmacies may offer cheaper prices. ? ?The website www.goodrx.com contains coupons for medications through different pharmacies. The prices here do not account for what the cost may be with help from insurance (it may be cheaper with your insurance), but the website can give you the price if you did not use any insurance.  ?- You can print the associated coupon and take it with your prescription to the pharmacy.  ?- You may also stop by our office during regular business hours and pick up a GoodRx coupon card.  ?- If you need your prescription sent electronically to a different pharmacy, notify our office through [facility] or by phone at [phone] option 4. ? ? ? ? ?Si Usted Necesita Algo Despu?s de Su Visita ? ?Tambi?n puede enviarnos un mensaje a trav?s de MyChart. Por lo general respondemos a los mensajes de MyChart en el transcurso de 1 a 2 d?as h?biles. ? ?Para renovar recetas, por favor  pida a su farmacia que se ponga en contacto con nuestra oficina. Nuestro n?mero de fax es el [phone]. ? ?Si tiene un asunto urgente cuando la cl?nica est? cerrada y que no puede esperar hasta el siguiente d?a

## 2022-02-19 NOTE — Progress Notes (Signed)
? ?New Patient Visit ? ?Subjective  ?Heather Browning is a 66 y.o. female who presents for the following: Annual Exam. ? ?New patient presents for Total-Body Skin Exam (TBSE) for skin cancer screening and mole check.  The patient has spots, moles and lesions to be evaluated, some may be new or changing and the patient has concerns that these could be cancer. Patient has several spots on the face she is concerned about. She has had PDT treatments to the face in the past by dermatologist in [place]. 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 She has not been seen by a dermatologist in 5 years. ? ? ?The following portions of the chart were reviewed this encounter and updated as appropriate:  ?  ?  ? ?Review of Systems:  No other skin or systemic complaints except as noted in HPI or Assessment and Plan. ? ?Objective  ?Well appearing patient in no apparent distress; mood and affect are within normal limits. ? ?A full examination was performed including scalp, head, eyes, ears, nose, lips, neck, chest, axillae, abdomen, back, buttocks, bilateral upper extremities, bilateral lower extremities, hands, feet, fingers, toes, fingernails, and toenails. All findings within normal limits unless otherwise noted below. ? ?R forehead x 1, R cheek x 5, L cheek x 4, L paranasal x 1, L upper back x 1 (12) ?Pink scaly macules ? ?Left Lower Back ?Pink scaly patch ? ?right upper inner arm ?4.0 mm x 3.0 mm two tone brown macule  ? ? ? ? ? ? ? ?right lower back ?3.0 mm med dark brown macule ? ? ? ? ? ? ? ? ?Assessment & Plan  ?Skin cancer screening performed today. ? ?Actinic Damage - Severe, confluent actinic changes with pre-cancerous actinic keratoses  ?- Severe, chronic, not at goal, secondary to cumulative UV radiation exposure over time ?- diffuse scaly erythematous macules and papules with underlying dyspigmentation ?- Discussed Prescription "Field Treatment" for Severe, Chronic Confluent Actinic Changes with  Pre-Cancerous Actinic Keratoses ?Field treatment involves treatment of an entire area of skin that has confluent Actinic Changes (Sun/ Ultraviolet light damage) and PreCancerous Actinic Keratoses by method of PhotoDynamic Therapy (PDT) and/or prescription Topical Chemotherapy agents such as 5-fluorouracil, 5-fluorouracil/calcipotriene, and/or imiquimod.  The purpose is to decrease the number of clinically evident and subclinical PreCancerous lesions to prevent progression to development of skin cancer by chemically destroying early precancer changes that may or may not be visible.  It has been shown to reduce the risk of developing skin cancer in the treated area. As a result of treatment, redness, scaling, crusting, and open sores may occur during treatment course. One or more than one of these methods may be used and may have to be used several times to control, suppress and eliminate the PreCancerous changes. Discussed treatment course, expected reaction, and possible side effects. ?- Recommend daily broad spectrum sunscreen SPF 30+ to sun-exposed areas, reapply every 2 hours as needed.  ?- Staying in the shade or wearing long sleeves, sun glasses (UVA+UVB protection) and wide brim hats (4-inch brim around the entire circumference of the hat) are also recommended. ?- Call for new or changing lesions. ?- recommend PDT to face x 2 txs, 1 mo apart ? ?History of Skin Cancer  ?Clear. Observe for recurrence. Will request records from previous dermatologist in [place].  ?Call clinic for new or changing lesions.   ?Recommend regular skin exams, daily broad-spectrum spf 30+ sunscreen use, and photoprotection.    ? ?  Lentigines ?- Scattered tan macules ?- Due to sun exposure ?- Benign-appering, observe ?- Recommend daily broad spectrum sunscreen SPF 30+ to sun-exposed areas, reapply every 2 hours as needed. ?- Call for any changes ? ?Seborrheic Keratoses ?- Stuck-on, waxy, tan-brown papules and/or plaques, including right  knee  ?- Benign-appearing ?- Discussed benign etiology and prognosis. ?- Observe ?- Call for any changes ? ? ?AK (actinic keratosis) (12) ?R forehead x 1, R cheek x 5, L cheek x 4, L paranasal x 1, L upper back x 1 ? ?Actinic keratoses are precancerous spots that appear secondary to cumulative UV radiation exposure/sun exposure over time. They are chronic with expected duration over 1 year. A portion of actinic keratoses will progress to squamous cell carcinoma of the skin. It is not possible to reliably predict which spots will progress to skin cancer and so treatment is recommended to prevent development of skin cancer. ? ?Recommend daily broad spectrum sunscreen SPF 30+ to sun-exposed areas, reapply every 2 hours as needed.  ?Recommend staying in the shade or wearing long sleeves, sun glasses (UVA+UVB protection) and wide brim hats (4-inch brim around the entire circumference of the hat). ?Call for new or changing lesions. ? ?Destruction of lesion - R forehead x 1, R cheek x 5, L cheek x 4, L paranasal x 1, L upper back x 1 ? ?Destruction method: cryotherapy   ?Informed consent: discussed and consent obtained   ?Lesion destroyed using liquid nitrogen: Yes   ?Region frozen until ice ball extended beyond lesion: Yes   ?Outcome: patient tolerated procedure well with no complications   ?Post-procedure details: wound care instructions given   ?Additional details:  Prior to procedure, discussed risks of blister formation, small wound, skin dyspigmentation, or rare scar following cryotherapy. Recommend Vaseline ointment to treated areas while healing. ? ? ?Nummular dermatitis ?Left Lower Back ? ?Recommend mild soap and moisturizing cream 1-2 times daily.  Gentle skin care handout provided.  ? ?Start mometasone cream Apply to AA back qd/bid prn itch dsp Rf. Avoid face, groin, axilla. ? ?Topical steroids (such as triamcinolone, fluocinolone, fluocinonide, mometasone, clobetasol, halobetasol, betamethasone, hydrocortisone)  can cause thinning and lightening of the skin if they are used for too long in the same area. Your physician has selected the right strength medicine for your problem and area affected on the body. Please use your medication only as directed by your physician to prevent side effects.  ? ? ?mometasone (ELOCON) 0.1 % cream - Left Lower Back ?Apply to itchy areas on body once to twice daily as needed. Avoid face, groin, axilla. ? ?Neoplasm of uncertain behavior of skin (2) ?right upper inner arm ? ?Epidermal / dermal shaving ? ?Lesion diameter (cm):  0.6 ?Informed consent: discussed and consent obtained   ?Patient was prepped and draped in usual sterile fashion: Area prepped with alcohol. ?Anesthesia: the lesion was anesthetized in a standard fashion   ?Anesthetic:  1% lidocaine w/ epinephrine 1-100,000 buffered w/ 8.4% NaHCO3 ?Instrument used: flexible razor blade   ?Hemostasis achieved with: pressure, aluminum chloride and electrodesiccation   ?Outcome: patient tolerated procedure well   ?Post-procedure details: wound care instructions given   ?Post-procedure details comment:  Ointment and small bandage applied ? ?Specimen 1 - Surgical pathology ?Differential Diagnosis: Nevus r/o Dysplasia ?Check Margins: Yes ?4.0 mm x 3.0 mm two tone brown macule ? ?right lower back ? ?Epidermal / dermal shaving ? ?Lesion diameter (cm):  0.5 ?Informed consent: discussed and consent obtained   ?Patient was  prepped and draped in usual sterile fashion: Area prepped with alcohol. ?Anesthesia: the lesion was anesthetized in a standard fashion   ?Anesthetic:  1% lidocaine w/ epinephrine 1-100,000 buffered w/ 8.4% NaHCO3 ?Instrument used: flexible razor blade   ?Hemostasis achieved with: pressure, aluminum chloride and electrodesiccation   ?Outcome: patient tolerated procedure well   ?Post-procedure details: wound care instructions given   ?Post-procedure details comment:  Ointment and small bandage applied ? ?Specimen 2 - Surgical  pathology ?Differential Diagnosis: Nevus r/o Dysplasia ?Check Margins: Yes ?3.0 mm med dark brown macule ? ? ? ? ?Return in about 6 months (around 08/22/2022) for AKs. Also PDT to face x 2, 1 month apart.. ? ?

## 2022-02-26 ENCOUNTER — Telehealth: Payer: Self-pay

## 2022-02-26 NOTE — Telephone Encounter (Signed)
Advised pt of bx results.  Scheduled pt for excision of severe dysplastic nevus, and advised we would do shave removal to moderate to severe dysplastic nevus at her post op appt./sh ?

## 2022-02-26 NOTE — Telephone Encounter (Signed)
Left message for patient to call for biopsy results. 

## 2022-02-26 NOTE — Telephone Encounter (Signed)
-----   Message from Brendolyn Patty, MD sent at 02/26/2022 12:49 PM EDT ----- ?1. 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 Skin , right lower back ?DYSPLASTIC JUNCTIONAL NEVUS WITH SEVERE ATYPIA, PERIPHERAL MARGIN INVOLVED, SEE DESCRIPTION ? ?1. Mod/severely atypical mole, needs repeat shave removal ?2. Severely atypical mole, needs excision ? ? - please call patient ?

## 2022-03-20 ENCOUNTER — Ambulatory Visit: Payer: BC Managed Care – PPO | Admitting: Dermatology

## 2022-03-20 DIAGNOSIS — L570 Actinic keratosis: Secondary | ICD-10-CM | POA: Diagnosis not present

## 2022-03-20 MED ORDER — AMINOLEVULINIC ACID HCL 20 % EX SOLR
1.0000 "application " | Freq: Once | CUTANEOUS | Status: AC
  Administered 2022-03-20: 354 mg via TOPICAL

## 2022-03-20 NOTE — Progress Notes (Signed)
Patient completed PDT therapy today.  1. AK (actinic keratosis) Head - Anterior (Face)  Photodynamic therapy - Head - Anterior (Face) Procedure discussed: discussed risks, benefits, side effects. and alternatives   Prep: site scrubbed/prepped with acetone   Location:  Face Number of lesions:  Multiple Type of treatment:  Blue light Aminolevulinic Acid (see MAR for details): Levulan Number of Levulan sticks used:  1 Incubation time (minutes):  60 Number of minutes under lamp:  16 Number of seconds under lamp:  40 Cooling:  Floor fan Outcome: patient tolerated procedure well with no complications   Post-procedure details: sunscreen applied    Aminolevulinic Acid HCl 20 % SOLR 354 mg - Head - Anterior (Face)   Patient provided samples of Solbar Suncreen and [employer].   Johnsie Kindred, RMA  Documentation: I have reviewed the above documentation for accuracy and completeness, and I agree with the above.  Brendolyn Patty MD

## 2022-03-20 NOTE — Patient Instructions (Signed)

## 2022-03-26 ENCOUNTER — Encounter: Payer: BC Managed Care – PPO | Admitting: Dermatology

## 2022-04-02 ENCOUNTER — Encounter: Payer: Self-pay | Admitting: Dermatology

## 2022-04-02 ENCOUNTER — Ambulatory Visit (INDEPENDENT_AMBULATORY_CARE_PROVIDER_SITE_OTHER): Payer: BC Managed Care – PPO | Admitting: Dermatology

## 2022-04-02 DIAGNOSIS — D239 Other benign neoplasm of skin, unspecified: Secondary | ICD-10-CM

## 2022-04-02 DIAGNOSIS — D235 Other benign neoplasm of skin of trunk: Secondary | ICD-10-CM

## 2022-04-02 NOTE — Patient Instructions (Signed)

## 2022-04-02 NOTE — Progress Notes (Signed)
   Follow-Up Visit   Subjective  Heather Browning is a 66 y.o. female who presents for the following: Severe dysplastic nevus, bx proven (R lower back, pt presents for excision) and Dysplatic nevus (R upper inner arm, bx proven moderate to severe).   The following portions of the chart were reviewed this encounter and updated as appropriate:       Review of Systems:  No other skin or systemic complaints except as noted in HPI or Assessment and Plan.  Objective  Well appearing patient in no apparent distress; mood and affect are within normal limits.  A focused examination was performed including back. Relevant physical exam findings are noted in the Assessment and Plan.    R lower back Pink bx site 0.7cm  R upper inner arm Pink bx site    Assessment & Plan  Dysplastic nevus (2) R upper inner arm; R lower back  R lower back Severe atypia bx proven, excised today R upper inner arm, moderate to severe atypia, plan shave removal on f/u  Skin excision - R lower back  Lesion length (cm):  0.7 Lesion width (cm):  0.7 Margin per side (cm):  0.2 Total excision diameter (cm):  1.1 Informed consent: discussed and consent obtained   Timeout: patient name, date of birth, surgical site, and procedure verified   Procedure prep:  Patient was prepped and draped in usual sterile fashion Prep type:  Povidone-iodine Anesthesia: the lesion was anesthetized in a standard fashion   Anesthetic:  1% lidocaine w/ epinephrine 1-100,000 buffered w/ 8.4% NaHCO3 (7cc lido w/ epi, 6cc bupivicaine, Total of 13cc) Instrument used: #15 blade   Hemostasis achieved with: pressure and electrodesiccation   Outcome: patient tolerated procedure well with no complications   Post-procedure details: sterile dressing applied and wound care instructions given   Dressing type: petrolatum and pressure dressing   Additional details:  Tag at 3:00 lateral  Skin repair - R lower back Complexity:  Intermediate Final  length (cm):  2.8 Informed consent: discussed and consent obtained   Timeout: patient name, date of birth, surgical site, and procedure verified   Reason for type of repair: reduce tension to allow closure, reduce the risk of dehiscence, infection, and necrosis, reduce subcutaneous dead space and avoid a hematoma, preserve normal anatomical and functional relationships and enhance both functionality and cosmetic results   Undermining: edges undermined   Subcutaneous layers (deep stitches):  Suture size:  3-0 Suture type: Vicryl (polyglactin 910)   Stitches:  Buried vertical mattress Fine/surface layer approximation (top stitches):  Suture size:  3-0 Suture type: nylon   Stitches: simple interrupted   Suture removal (days):  7 Hemostasis achieved with: suture Outcome: patient tolerated procedure well with no complications   Post-procedure details: sterile dressing applied and wound care instructions given   Dressing type: pressure dressing (Mupirocin)    Specimen 1 - Surgical pathology Differential Diagnosis: D48.5 Bx proven Severe Dysplastic Nevus  Check Margins: yes Pink bx site 0.7cm [id number] Tag at 3:00 lateral   Return in about 1 week (around 04/09/2022) for suture removal.  I, Sonya Hupman, RMA, am acting as scribe for Brendolyn Patty, MD .  Documentation: I have reviewed the above documentation for accuracy and completeness, and I agree with the above.  Brendolyn Patty MD

## 2022-04-03 ENCOUNTER — Telehealth: Payer: Self-pay

## 2022-04-03 NOTE — Telephone Encounter (Signed)
Pt doing fine after yesterdays surgery./sh 

## 2022-04-09 ENCOUNTER — Ambulatory Visit (INDEPENDENT_AMBULATORY_CARE_PROVIDER_SITE_OTHER): Payer: BC Managed Care – PPO | Admitting: Dermatology

## 2022-04-09 DIAGNOSIS — L570 Actinic keratosis: Secondary | ICD-10-CM | POA: Diagnosis not present

## 2022-04-09 DIAGNOSIS — D2361 Other benign neoplasm of skin of right upper limb, including shoulder: Secondary | ICD-10-CM | POA: Diagnosis not present

## 2022-04-09 DIAGNOSIS — L578 Other skin changes due to chronic exposure to nonionizing radiation: Secondary | ICD-10-CM

## 2022-04-09 NOTE — Patient Instructions (Addendum)
Wound Care Instructions  Cleanse wound gently with soap and water once a day then pat dry with clean gauze. Apply a thing coat of Petrolatum (petroleum jelly, "Vaseline") over the wound (unless you have an allergy to this). We recommend that you use a new, sterile tube of Vaseline. Do not pick or remove scabs. Do not remove the yellow or white "healing tissue" from the base of the wound.  Cover the wound with fresh, clean, nonstick gauze and secure with paper tape. You may use Band-Aids in place of gauze and tape if the would is small enough, but would recommend trimming much of the tape off as there is often too much. Sometimes Band-Aids can irritate the skin.  You should call the office for your biopsy report after 1 week if you have not already been contacted.  If you experience any problems, such as abnormal amounts of bleeding, swelling, significant bruising, significant pain, or evidence of infection, please call the office immediately.  FOR ADULT SURGERY PATIENTS: If you need something for pain relief you may take 1 extra strength Tylenol (acetaminophen) AND 2 Ibuprofen (200mg each) together every 4 hours as needed for pain. (do not take these if you are allergic to them or if you have a reason you should not take them.) Typically, you may only need pain medication for 1 to 3 days.    Due to recent changes in healthcare laws, you may see results of your pathology and/or laboratory studies on MyChart before the doctors have had a chance to review them. We understand that in some cases there may be results that are confusing or concerning to you. Please understand that not all results are received at the same time and often the doctors may need to interpret multiple results in order to provide you with the best plan of care or course of treatment. Therefore, we ask that you please give us 2 business days to thoroughly review all your results before contacting the office for clarification. Should we  see a critical lab result, you will be contacted sooner.   If You Need Anything After Your Visit  If you have any questions or concerns for your doctor, please call our main line at [phone] and press option 4 to reach your doctor's medical assistant. If no one answers, please leave a voicemail as directed and we will return your call as soon as possible. Messages left after 4 pm will be answered the following business day.   You may also send us a message via MyChart. We typically respond to MyChart messages within 1-2 business days.  For prescription refills, please ask your pharmacy to contact our office. Our fax number is [phone].  If you have an urgent issue when the clinic is closed that cannot wait until the next business day, you can page your doctor at the number below.    Please note that while we do our best to be available for urgent issues outside of office hours, we are not available 24/7.   If you have an urgent issue and are unable to reach us, you may choose to seek medical care at your doctor's office, retail clinic, urgent care center, or emergency room.  If you have a medical emergency, please immediately call 911 or go to the emergency department.  Pager Numbers  - Dr. Kowalski: [phone]  - Dr. Moye: [phone]  - Dr. Stewart: [phone]  In the event of inclement weather, please call our main line at [phone]   for an update on the status of any delays or closures.  Dermatology Medication Tips: Please keep the boxes that topical medications come in in order to help keep track of the instructions about where and how to use these. Pharmacies typically print the medication instructions only on the boxes and not directly on the medication tubes.   If your medication is too expensive, please contact our office at [phone] option 4 or send us a message through MyChart.   We are unable to tell what your co-pay for medications will be in advance  as this is different depending on your insurance coverage. However, we may be able to find a substitute medication at lower cost or fill out paperwork to get insurance to cover a needed medication.   If a prior authorization is required to get your medication covered by your insurance company, please allow us 1-2 business days to complete this process.  Drug prices often vary depending on where the prescription is filled and some pharmacies may offer cheaper prices.  The website www.goodrx.com contains coupons for medications through different pharmacies. The prices here do not account for what the cost may be with help from insurance (it may be cheaper with your insurance), but the website can give you the price if you did not use any insurance.  - You can print the associated coupon and take it with your prescription to the pharmacy.  - You may also stop by our office during regular business hours and pick up a GoodRx coupon card.  - If you need your prescription sent electronically to a different pharmacy, notify our office through [facility] MyChart or by phone at [phone] option 4.     Si Usted Necesita Algo Despus de Su Visita  Tambin puede enviarnos un mensaje a travs de MyChart. Por lo general respondemos a los mensajes de MyChart en el transcurso de 1 a 2 das hbiles.  Para renovar recetas, por favor pida a su farmacia que se ponga en contacto con nuestra oficina. Nuestro nmero de fax es el [phone].  Si tiene un asunto urgente cuando la clnica est cerrada y que no puede esperar hasta el siguiente da hbil, puede llamar/localizar a su doctor(a) al nmero que aparece a continuacin.   Por favor, tenga en cuenta que aunque hacemos todo lo posible para estar disponibles para asuntos urgentes fuera del horario de oficina, no estamos disponibles las 24 horas del da, los 7 das de la semana.   Si tiene un problema urgente y no puede comunicarse con nosotros, puede optar  por buscar atencin mdica  en el consultorio de su doctor(a), en una clnica privada, en un centro de atencin urgente o en una sala de emergencias.  Si tiene una emergencia mdica, por favor llame inmediatamente al 911 o vaya a la sala de emergencias.  Nmeros de bper  - Dr. Kowalski: [phone]  - Dra. Moye: [phone]  - Dra. Stewart: [phone]  En caso de inclemencias del tiempo, por favor llame a nuestra lnea principal al [phone] para una actualizacin sobre el estado de cualquier retraso o cierre.  Consejos para la medicacin en dermatologa: Por favor, guarde las cajas en las que vienen los medicamentos de uso tpico para ayudarle a seguir las instrucciones sobre dnde y cmo usarlos. Las farmacias generalmente imprimen las instrucciones del medicamento slo en las cajas y no directamente en los tubos del medicamento.   Si su medicamento es muy caro, por favor, pngase en contacto con nuestra   oficina llamando al [phone] y presione la opcin 4 o envenos un mensaje a travs de MyChart.   No podemos decirle cul ser su copago por los medicamentos por adelantado ya que esto es diferente dependiendo de la cobertura de su seguro. Sin embargo, es posible que podamos encontrar un medicamento sustituto a menor costo o llenar un formulario para que el seguro cubra el medicamento que se considera necesario.   Si se requiere una autorizacin previa para que su compaa de seguros cubra su medicamento, por favor permtanos de 1 a 2 das hbiles para completar este proceso.  Los precios de los medicamentos varan con frecuencia dependiendo del lugar de dnde se surte la receta y alguna farmacias pueden ofrecer precios ms baratos.  El sitio web www.goodrx.com tiene cupones para medicamentos de diferentes farmacias. Los precios aqu no tienen en cuenta lo que podra costar con la ayuda del seguro (puede ser ms barato con su seguro), pero el sitio web puede darle el precio si  no utiliz ningn seguro.  - Puede imprimir el cupn correspondiente y llevarlo con su receta a la farmacia.  - Tambin puede pasar por nuestra oficina durante el horario de atencin regular y recoger una tarjeta de cupones de GoodRx.  - Si necesita que su receta se enve electrnicamente a una farmacia diferente, informe a nuestra oficina a travs de MyChart de  o por telfono llamando al [phone] y presione la opcin 4.  

## 2022-04-09 NOTE — Progress Notes (Signed)
Follow-Up Visit   Subjective  Heather Browning is a 66 y.o. female who presents for the following: Follow-up (Patient here today for suture removal at right lower back for bx proven severe dysplastic nevus. ).  Patient also here today for shave removal of DYSPLASTIC NEVUS WITH MODERATE TO SEVERE ATYPIA at right upper inner arm.  She also has questions about Aks on face and chest.  She has had one PDT treatment to the face.  The following portions of the chart were reviewed this encounter and updated as appropriate:       Review of Systems:  No other skin or systemic complaints except as noted in HPI or Assessment and Plan.  Objective  Well appearing patient in no apparent distress; mood and affect are within normal limits.  A focused examination was performed including back, right arm. Relevant physical exam findings are noted in the Assessment and Plan.  Right Upper Arm  Right Upper Arm Pink biopsy site    Assessment & Plan  Dysplastic nevus of right upper extremity Right Upper Arm  Epidermal / dermal shaving - Right Upper Arm  Lesion diameter (cm):  0.8 Informed consent: discussed and consent obtained   Patient was prepped and draped in usual sterile fashion: area prepped with alcohol. Anesthesia: the lesion was anesthetized in a standard fashion   Anesthetic:  1% lidocaine w/ epinephrine 1-100,000 buffered w/ 8.4% NaHCO3 Instrument used: flexible razor blade   Hemostasis achieved with: pressure, aluminum chloride and electrodesiccation   Outcome: patient tolerated procedure well   Post-procedure details: wound care instructions given   Post-procedure details comment:  Ointment and small bandage applied  Specimen 1 - Surgical pathology Differential Diagnosis: Bx proven DYSPLASTIC JUNCTIONAL LENTIGINOUS NEVUS WITH MODERATE TO SEVERE ATYPIA  Check Margins: yes Pink biopsy site [phone]   Encounter for Removal of Sutures Dysplastic nevus - Incision site at the  right lower back is clean, dry and intact - Wound cleansed, sutures removed, wound cleansed and steri strips applied.  - Discussed pathology results showing margins free  - Patient advised to keep steri-strips dry until they fall off. - Scars remodel for a full year. - Once steri-strips fall off, patient can apply over-the-counter silicone scar cream each night to help with scar remodeling if desired. - Patient advised to call with any concerns or if they notice any new or changing lesions.  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, face - Severe, chronic, not at goal, secondary to cumulative UV radiation exposure over time - diffuse scaly erythematous macules and papules with underlying dyspigmentation - Discussed Prescription "Field Treatment" for Severe, Chronic Confluent Actinic Changes with Pre-Cancerous Actinic Keratoses Field treatment involves treatment of an entire area of skin that has confluent Actinic Changes (Sun/ Ultraviolet light damage) and PreCancerous Actinic Keratoses by method of PhotoDynamic Therapy (PDT) and/or prescription Topical Chemotherapy agents such as 5-fluorouracil, 5-fluorouracil/calcipotriene, and/or imiquimod.  The purpose is to decrease the number of clinically evident and subclinical PreCancerous lesions to prevent progression to development of skin cancer by chemically destroying early precancer changes that may or may not be visible.  It has been shown to reduce the risk of developing skin cancer in the treated area. As a result of treatment, redness, scaling, crusting, and open sores may occur during treatment course. One or more than one of these methods may be used and may have to be used several times to control, suppress and eliminate the PreCancerous changes. Discussed treatment  course, expected reaction, and possible side effects. - Recommend daily broad spectrum sunscreen SPF 30+ to sun-exposed areas,  reapply every 2 hours as needed.  - Staying in the shade or wearing long sleeves, sun glasses (UVA+UVB protection) and wide brim hats (4-inch brim around the entire circumference of the hat) are also recommended. - Call for new or changing lesions. Return for as scheduled PDT to face, schedule for PDT to chest.x 2 treatments  Graciella Belton, RMA, am acting as scribe for Brendolyn Patty, MD .  Documentation: I have reviewed the above documentation for accuracy and completeness, and I agree with the above.  Brendolyn Patty MD

## 2022-04-11 ENCOUNTER — Telehealth: Payer: Self-pay

## 2022-04-11 NOTE — Telephone Encounter (Signed)
Advised patient shave removal of the right upper arm was dysplastic nevus - margins free.

## 2022-04-11 NOTE — Telephone Encounter (Signed)
-----   Message from Brendolyn Patty, MD sent at 04/10/2022  6:27 PM EDT ----- Skin , right upper arm NO RESIDUAL DYSPLASTIC NEVUS, MARGINS FREE  Margins free post shave removal bx-proven mod/severe dysplastic nevus   - please call patient

## 2022-05-01 ENCOUNTER — Ambulatory Visit: Payer: BC Managed Care – PPO

## 2022-05-03 ENCOUNTER — Ambulatory Visit: Payer: BC Managed Care – PPO

## 2022-07-10 ENCOUNTER — Ambulatory Visit (INDEPENDENT_AMBULATORY_CARE_PROVIDER_SITE_OTHER): Payer: BC Managed Care – PPO | Admitting: Dermatology

## 2022-07-10 DIAGNOSIS — L570 Actinic keratosis: Secondary | ICD-10-CM | POA: Diagnosis not present

## 2022-07-10 IMAGING — CR DG CHEST 2V
3 series · 3 of 3 positions shown · non-contrast
Comparison: 12/03/2017

CLINICAL DATA: Chest pain.

EXAM:
CHEST - 2 VIEW

[chest pa]
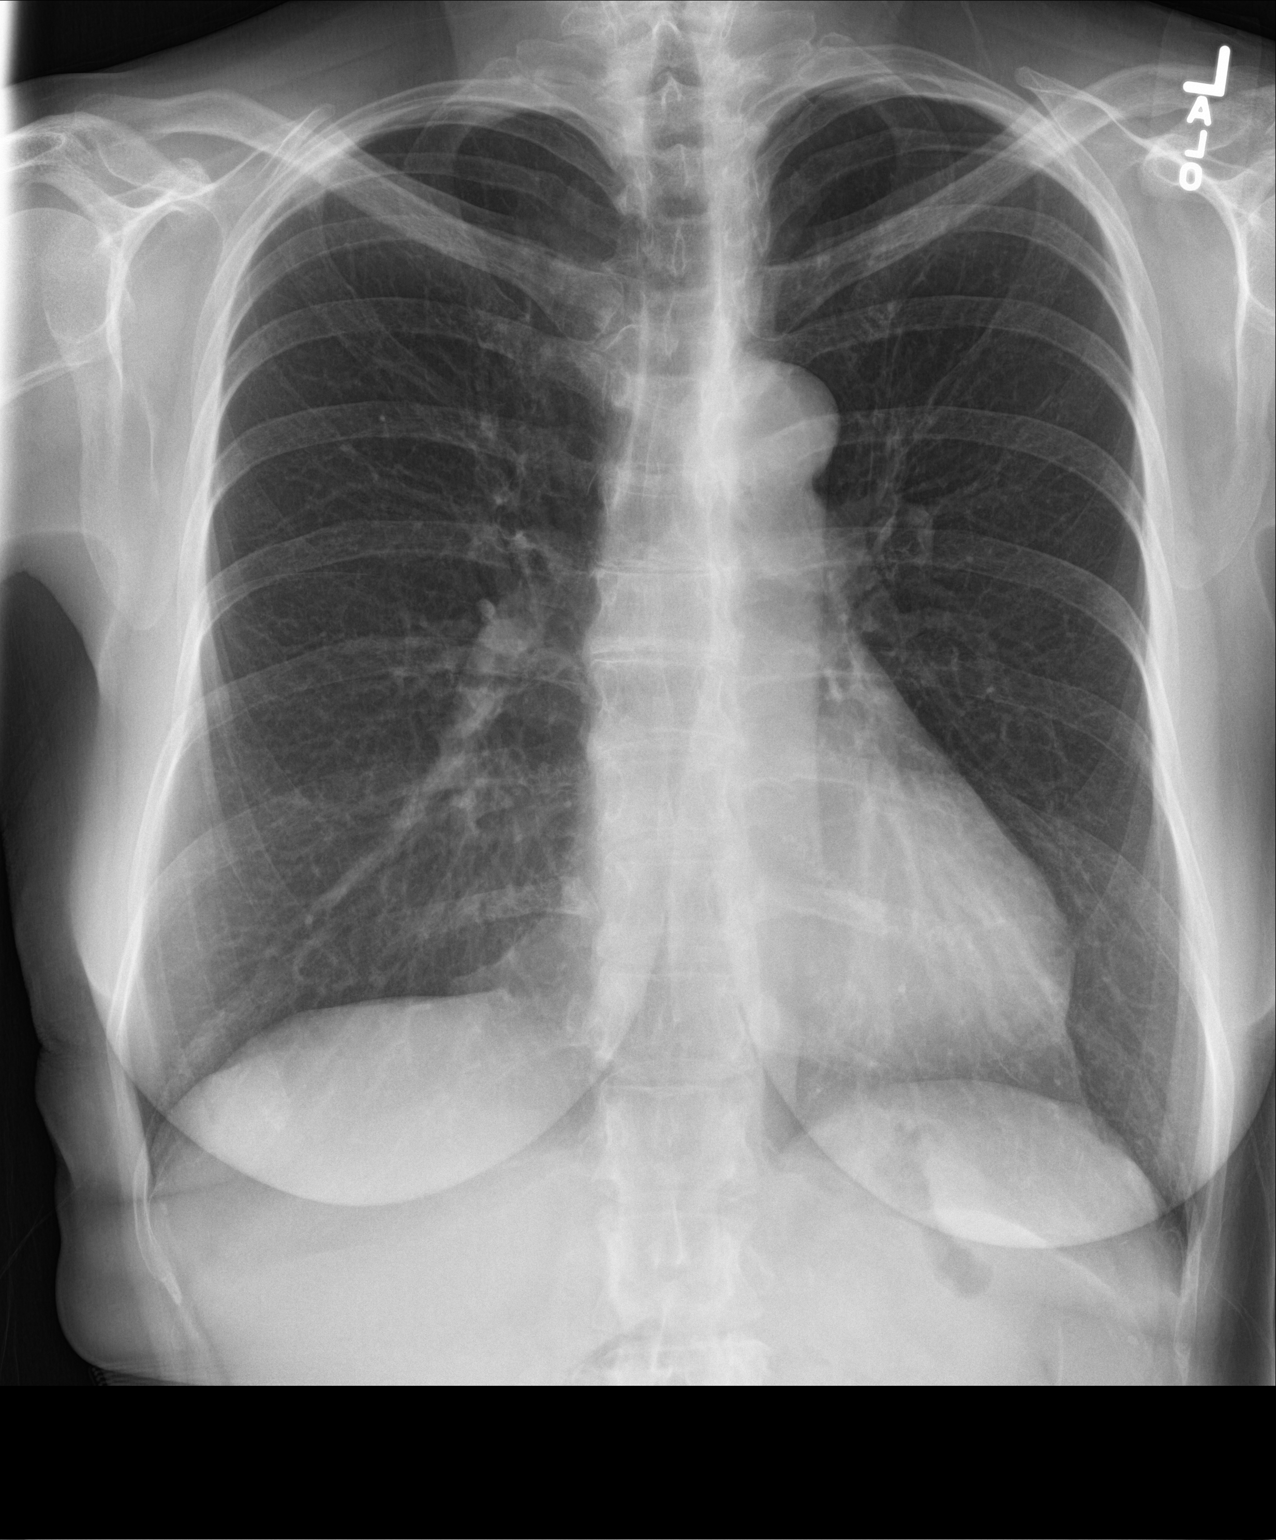

[chest lat (1 of 2)]
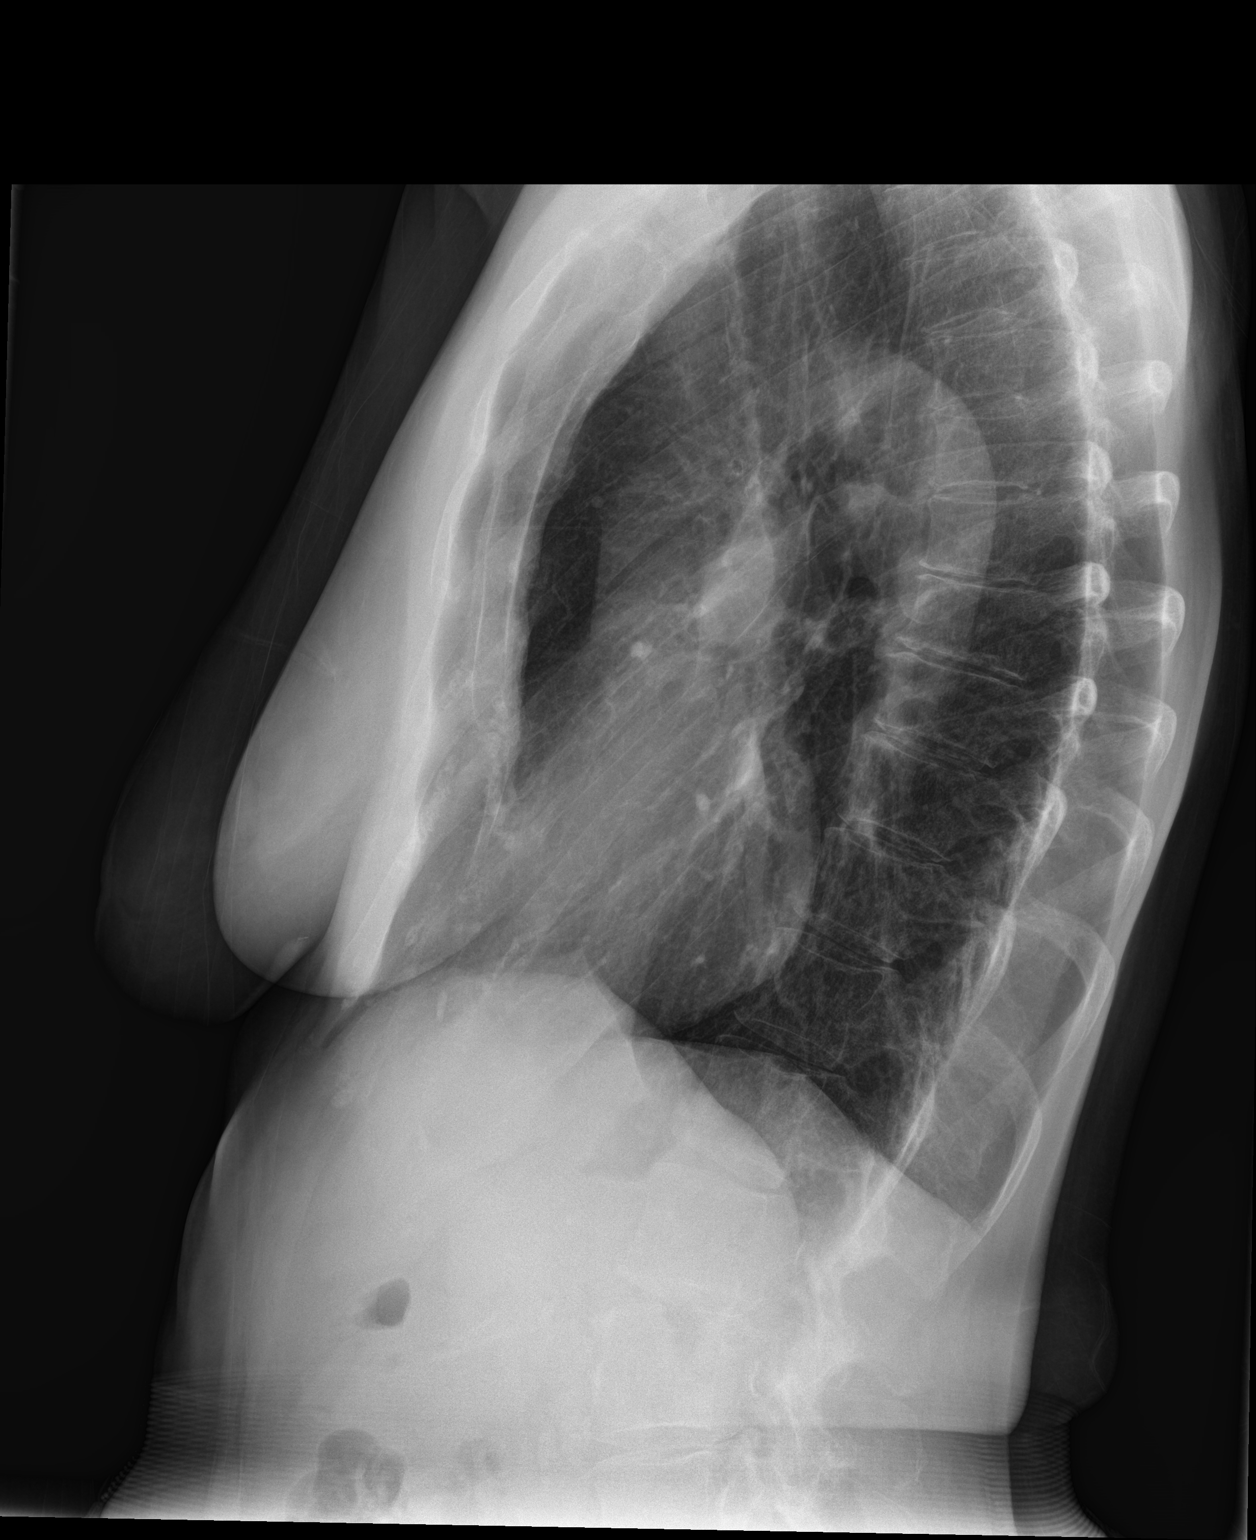

[chest lat (2 of 2)]
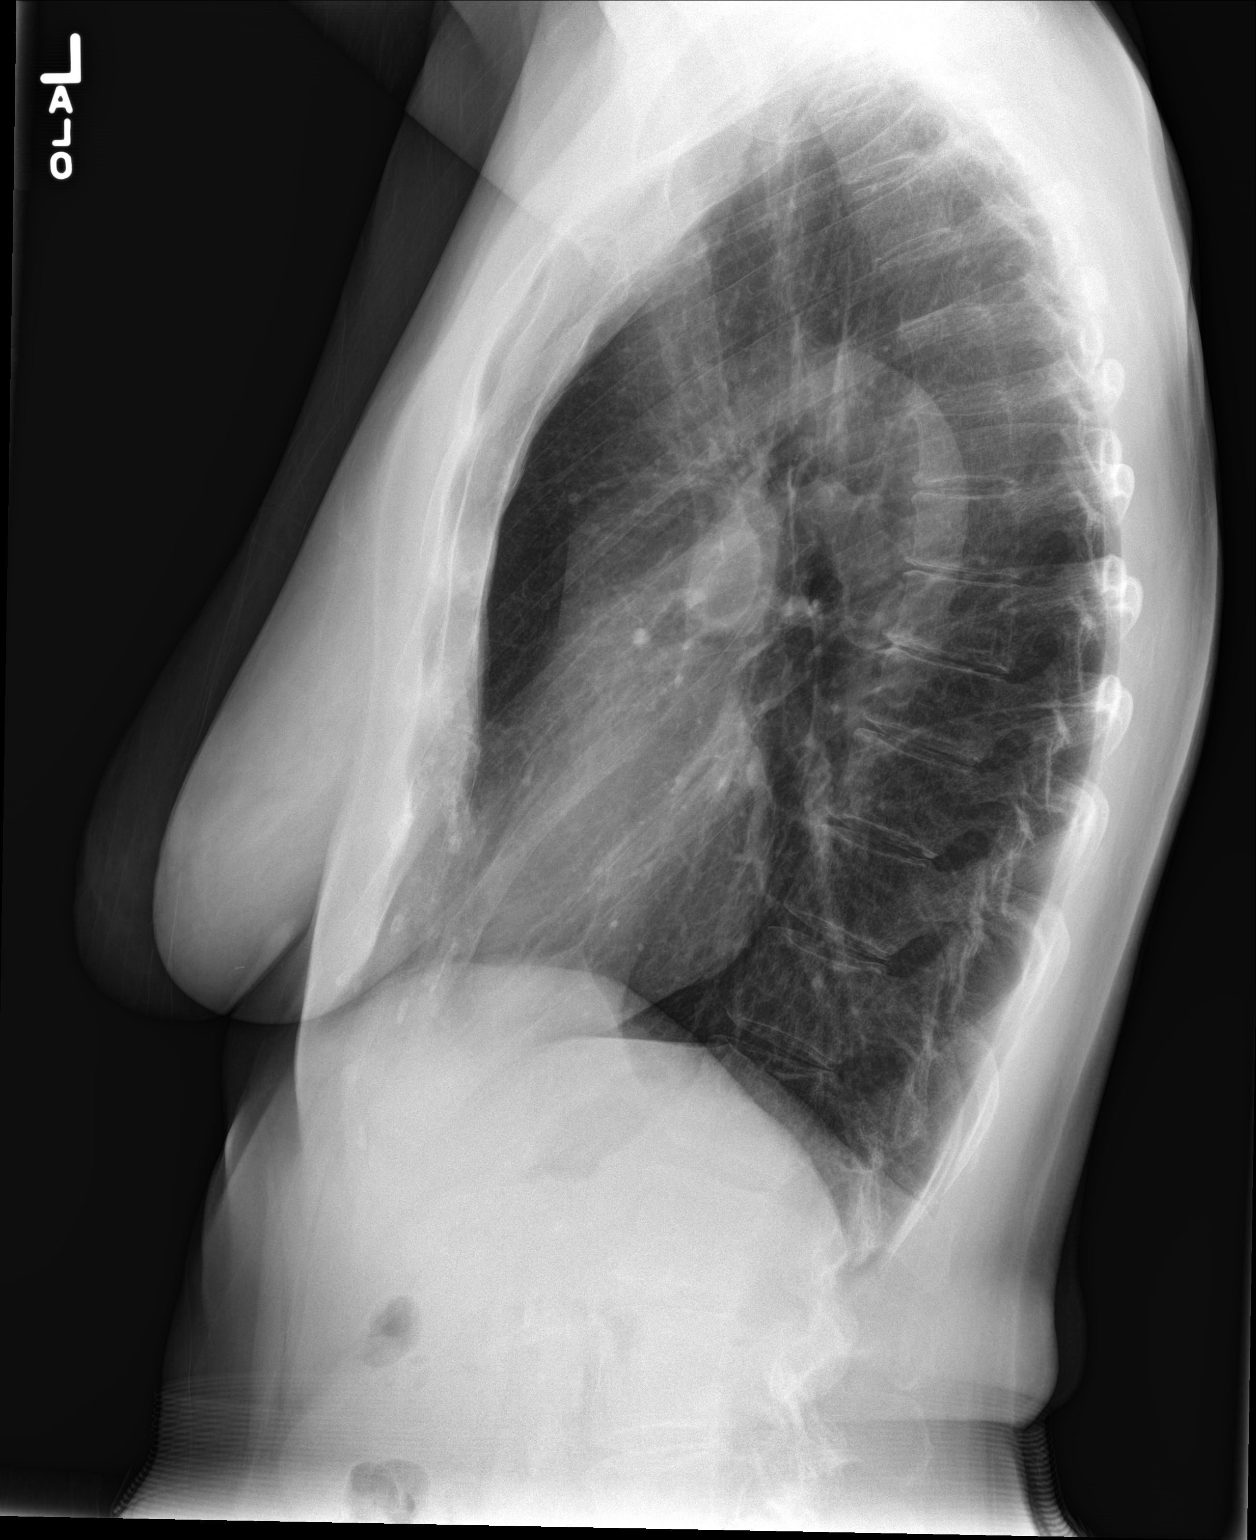

[3 of 3 positions shown; findings below may reference images not displayed]

FINDINGS: A mild pectus excavatum deformity. Midline trachea. Normal heart
size and mediastinal contours. No pleural effusion or pneumothorax.
Clear lungs.
IMPRESSION: No active cardiopulmonary disease.

## 2022-07-10 MED ORDER — AMINOLEVULINIC ACID HCL 20 % EX SOLR
1.0000 | Freq: Once | CUTANEOUS | Status: AC
  Administered 2022-07-10: 354 mg via TOPICAL

## 2022-07-10 NOTE — Patient Instructions (Signed)

## 2022-07-10 NOTE — Progress Notes (Signed)
Patient completed PDT therapy today.  1. AK (actinic keratosis) Chest - Medial (Center)  Photodynamic therapy - Chest - Medial [facility]) Procedure discussed: discussed risks, benefits, side effects. and alternatives   Prep: site scrubbed/prepped with acetone   Location:  CHEST Number of lesions:  Multiple Type of treatment:  Blue light Aminolevulinic Acid (see MAR for details): Levulan Number of Levulan sticks used:  1 Incubation time (minutes):  120 Number of minutes under lamp:  16 Number of seconds under lamp:  40 Cooling:  Floor fan Outcome: patient tolerated procedure well with no complications   Post-procedure details: sunscreen applied and aftercare instructions given to patient    Related Medications Aminolevulinic Acid HCl 20 % SOLR 354 mg   [id number]  Abby Foster RMA  Documentation: I have reviewed the above documentation for accuracy and completeness, and I agree with the above.  Brendolyn Patty MD

## 2022-07-23 ENCOUNTER — Ambulatory Visit: Payer: BC Managed Care – PPO | Admitting: Dermatology

## 2022-07-23 DIAGNOSIS — L570 Actinic keratosis: Secondary | ICD-10-CM | POA: Diagnosis not present

## 2022-07-23 MED ORDER — AMINOLEVULINIC ACID HCL 20 % EX SOLR
1.0000 | Freq: Once | CUTANEOUS | Status: AC
  Administered 2022-07-23: 354 mg via TOPICAL

## 2022-07-23 NOTE — Patient Instructions (Signed)

## 2022-07-23 NOTE — Progress Notes (Signed)
Patient completed PDT therapy today.  1. AK (actinic keratosis) Head - Anterior (Face)  Photodynamic therapy - Head - Anterior (Face) Procedure discussed: discussed risks, benefits, side effects. and alternatives   Prep: site scrubbed/prepped with acetone   Location:  Face Number of lesions:  Multiple Type of treatment:  Blue light Aminolevulinic Acid (see MAR for details): Levulan Number of Levulan sticks used:  1 Incubation time (minutes):  60 Number of minutes under lamp:  16 Number of seconds under lamp:  40 Cooling:  Floor fan Outcome: patient tolerated procedure well with no complications   Post-procedure details: sunscreen applied and aftercare instructions given to patient    Related Medications Aminolevulinic Acid HCl 20 % SOLR 354 mg   [id number]    Abby Foster RMA  Documentation: I have reviewed the above documentation for accuracy and completeness, and I agree with the above.  Charlyne Robertshaw MD  

## 2022-07-25 ENCOUNTER — Ambulatory Visit: Payer: BC Managed Care – PPO

## 2022-08-15 ENCOUNTER — Ambulatory Visit: Payer: BC Managed Care – PPO

## 2022-08-16 ENCOUNTER — Telehealth: Payer: Self-pay

## 2022-08-16 ENCOUNTER — Ambulatory Visit: Payer: BC Managed Care – PPO

## 2022-08-16 NOTE — Telephone Encounter (Signed)
This patient came in for the Chest PDT this morning and was talking about how she did not feel like she really had good results from her chest PDT here a few weeks ago. She said that she had tingling and redness but that nothing peeled or scabbed off. I mentioned that we were starting a new Red light in a few weeks and she was very interested in possibly trying that if you were agreeable to that? Patient given a pamphlet of information on [place] and the red light procedure. Appointment was cancelled for today and if given the okay from you, we will schedule her for the red light in a few weeks.

## 2022-08-28 ENCOUNTER — Ambulatory Visit: Payer: BC Managed Care – PPO | Admitting: Dermatology

## 2022-09-25 ENCOUNTER — Ambulatory Visit: Payer: BC Managed Care – PPO

## 2022-11-10 ENCOUNTER — Ambulatory Visit
Admission: EM | Admit: 2022-11-10 | Discharge: 2022-11-10 | Disposition: A | Discharge status: Home / Self Care | Payer: BC Managed Care – PPO | Attending: Physician Assistant | Admitting: Physician Assistant

## 2022-11-10 ENCOUNTER — Encounter: Payer: Self-pay | Admitting: Emergency Medicine

## 2022-11-10 DIAGNOSIS — J0140 Acute pansinusitis, unspecified: Secondary | ICD-10-CM | POA: Diagnosis not present

## 2022-11-10 DIAGNOSIS — Z1152 Encounter for screening for COVID-19: Secondary | ICD-10-CM | POA: Diagnosis not present

## 2022-11-10 LAB — RESP PANEL BY RT-PCR (RSV, FLU A&B, COVID)  RVPGX2
Influenza A by PCR: NEGATIVE
Influenza B by PCR: NEGATIVE
Resp Syncytial Virus by PCR: NEGATIVE
SARS Coronavirus 2 by RT PCR: NEGATIVE

## 2022-11-10 LAB — GROUP A STREP BY PCR: Group A Strep by PCR: NOT DETECTED

## 2022-11-10 MED ORDER — AMOXICILLIN-POT CLAVULANATE 875-125 MG PO TABS: 1.0000 | ORAL_TABLET | Freq: Two times a day (BID) | ORAL | 0 refills | Status: AC

## 2022-11-10 MED ORDER — IPRATROPIUM BROMIDE 0.03 % NA SOLN: 2.0000 | Freq: Two times a day (BID) | NASAL | 12 refills | Status: DC

## 2022-11-10 NOTE — ED Provider Notes (Signed)
[facility]    CSN: [id number] Arrival date & time: 11/10/22  1317      History   Chief Complaint Chief Complaint  Patient presents with   Fever   Sore Throat    HPI Heather Browning is a 67 y.o. female.   Patient presents for evaluation of fever, nasal congestion, rhinorrhea, postnasal drip, sinus pain and pressure, sore throat, cough and headaches present for 10 days.  Fever peaking at 102.  Occurring daily.  Associated intermittent swollen glands and ear fullness bilaterally.  Cough is productive at times.  Has been able to tolerate food and liquids.  Attempted to use Motrin but began to see black drops in the stool therefore discontinued use.  History of a tonsillectomy.     Past Medical History:  Diagnosis Date   Basal cell carcinoma    multiple treated at derm in [place]   Dysplastic nevus 02/19/2022   R upper inner arm, mod-severe, shave removal 04/09/2022   Dysplastic nevus 02/19/2022   R lower back, severe atypia, Excised 04/02/2022   Shingles    Skin cancer    Squamous cell carcinoma of skin 2017   face   Vitamin D deficiency 12/25/2017    Patient Active Problem List   Diagnosis Date Noted   Pap smear for cervical cancer screening 07/18/2021   Anxiety 04/27/2021   Fever 03/21/2021   Acne 05/13/2019   Acute recurrent maxillary sinusitis 01/09/2019   Low vitamin B12 level 01/18/2018   Chronic cough 01/06/2018   Macrocytosis 12/25/2017   Vitamin D deficiency 12/25/2017   Zoster with other complications 50/27/7412    Past Surgical History:  Procedure Laterality Date   ABDOMINAL HYSTERECTOMY     pt has one ovary left   TONSILLECTOMY      OB History   No obstetric history on file.      Home Medications    Prior to Admission medications   Medication Sig Start Date End Date Taking? Authorizing Provider  ibuprofen (ADVIL) 200 MG tablet Take 200 mg by mouth every 6 (six) hours as needed.    [provider]  metroNIDAZOLE (FLAGYL)  500 MG tablet Take 1 tablet (500 mg total) by mouth 2 (two) times daily. 01/01/22   Krystianna Soth, Leitha Schuller, NP  mometasone (ELOCON) 0.1 % cream Apply to itchy areas on body once to twice daily as needed. Avoid face, groin, axilla. 02/19/22   Brendolyn Patty, MD  Multiple Vitamins-Minerals (MULTIVITAMIN GUMMIES WOMENS PO) Take by mouth daily. Patient not taking: Reported on 01/23/2021    [provider]  valACYclovir (VALTREX) 1000 MG tablet TAKE 1 TABLET BY MOUTH ONCE DAILY 12/15/18   Kathrine Haddock, NP  vitamin B-12 (CYANOCOBALAMIN) 500 MCG tablet Take 1,000 mcg by mouth daily. Patient not taking: Reported on 01/23/2021    [provider]    Family History Family History  Problem Relation Age of Onset   Polycythemia Mother    Heart disease Father    Heart disease Maternal Grandmother    Heart disease Maternal Grandfather    Heart disease Paternal Grandfather     Social History Social History   Tobacco Use   Smoking status: Never    Passive exposure: Never   Smokeless tobacco: Never  Vaping Use   Vaping Use: Never used  Substance Use Topics   Alcohol use: Yes    Comment: once a month/ wine   Drug use: No     Allergies   Macrobid [nitrofurantoin macrocrystal] and  Morphine and related   Review of Systems Review of Systems  Constitutional:  Positive for fever. Negative for activity change, appetite change, chills, diaphoresis, fatigue and unexpected weight change.  HENT:  Positive for congestion, postnasal drip, rhinorrhea, sinus pressure, sinus pain and sore throat. Negative for dental problem, drooling, ear discharge, ear pain, facial swelling, hearing loss, mouth sores, nosebleeds, sneezing, tinnitus, trouble swallowing and voice change.   Respiratory:  Positive for cough. Negative for apnea, choking, chest tightness, shortness of breath, wheezing and stridor.   Cardiovascular: Negative.   Gastrointestinal: Negative.   Skin: Negative.   Neurological:  Positive for  headaches. Negative for dizziness, tremors, seizures, syncope, facial asymmetry, speech difficulty, weakness, light-headedness and numbness.     Physical Exam Triage Vital Signs ED Triage Vitals  Enc Vitals Group     BP 11/10/22 1329 137/71     Pulse Rate 11/10/22 1329 100     Resp 11/10/22 1329 14     Temp 11/10/22 1329 98.6 F (37 C)     Temp Source 11/10/22 1329 Oral     SpO2 11/10/22 1329 97 %     Weight 11/10/22 1326 140 lb (63.5 kg)     Height 11/10/22 1326 '5\' 7"'$  (1.702 m)     Head Circumference --      Peak Flow --      Pain Score 11/10/22 1325 2     Pain Loc --      Pain Edu? --      Excl. in [place]? --    No data found.  Updated Vital Signs BP 137/71 (BP Location: Left Arm)   Pulse 100   Temp 98.6 F (37 C) (Oral)   Resp 14   Ht '5\' 7"'$  (1.702 m)   Wt 140 lb (63.5 kg)   SpO2 97%   BMI 21.93 kg/m   Visual Acuity Right Eye Distance:   Left Eye Distance:   Bilateral Distance:    Right Eye Near:   Left Eye Near:    Bilateral Near:     Physical Exam Vitals reviewed.  Constitutional:      Appearance: Normal appearance.  HENT:     Head: Normocephalic.     Right Ear: Tympanic membrane, ear canal and external ear normal.     Left Ear: Tympanic membrane, ear canal and external ear normal.     Nose: Congestion and rhinorrhea present.     Mouth/Throat:     Mouth: Mucous membranes are moist.     Pharynx: Posterior oropharyngeal erythema present.     Tonsils: No tonsillar exudate. 0 on the right. 0 on the left.  Cardiovascular:     Rate and Rhythm: Normal rate and regular rhythm.     Pulses: Normal pulses.     Heart sounds: Normal heart sounds.  Pulmonary:     Effort: Pulmonary effort is normal.     Breath sounds: Normal breath sounds.  Skin:    General: Skin is warm and dry.  Neurological:     Mental Status: She is alert and oriented to person, place, and time. Mental status is at baseline.  Psychiatric:        Mood and Affect: Mood normal.         Behavior: Behavior normal.      UC Treatments / Results  Labs (all labs ordered are listed, but only abnormal results are displayed) Labs Reviewed  GROUP A STREP BY PCR  RESP PANEL BY RT-PCR (RSV, FLU A&B, COVID)  [id number]    EKG   Radiology No results found.  Procedures Procedures (including critical care time)  Medications Ordered in UC Medications - No data to display  Initial Impression / Assessment and Plan / UC Course  I have reviewed the triage vital signs and the nursing notes.  Pertinent labs & imaging results that were available during my care of the patient were reviewed by me and considered in my medical decision making (see chart for details).  Acute nonrecurrent pansinusitis  Vitals are stable, patient is in no signs of distress nontoxic-appearing, presentation is consistent with a sinusitis, COVID, flu RSV testing negative, discussed with patient, prescribed Augmentin and ipratropium, may use additional over-the-counter medications as needed with follow-up with urgent care as needed Final Clinical Impressions(s) / UC Diagnoses   Final diagnoses:  None   Discharge Instructions   None    ED Prescriptions   None    PDMP not reviewed this encounter.   Hans Eden, [place] 11/10/22 [phone]

## 2022-11-10 NOTE — ED Triage Notes (Signed)
Patient reports worsening sore throat for the past 10 days.  Patient also reports fever.  Patient also reports sinus congestion and drainage that started 2 days after the sore throat started.

## 2022-11-10 NOTE — Discharge Instructions (Signed)
COVID, flu, RSV and strep testing negative  Symptoms are consistent with a sinus infection and as your symptoms have been present for 10 days without any signs of resolution we will provide bacterial coverage  Begin Augmentin every morning and every evening for 10 days, daily you will begin to see improvement in about 48 hours and steady progression from there  Begin use of nasal spray every morning and every evening to further help clear the sinuses and reduce pressure    You can take Tylenol and/or Ibuprofen as needed for fever reduction and pain relief.   For cough: honey 1/2 to 1 teaspoon (you can dilute the honey in water or another fluid).  You can also use guaifenesin and dextromethorphan for cough. You can use a humidifier for  congestion and cough.  If you don't have a humidifier, you can sit in the bathroom with the hot shower running.      For sore throat: try warm salt water gargles, cepacol lozenges, throat spray, warm tea or water with lemon/honey, popsicles or ice, or OTC cold relief medicine for throat discomfort.   For congestion: take a daily anti-histamine like Zyrtec, Claritin, and a oral decongestant, such as pseudoephedrine.  You can also use Flonase 1-2 sprays in each nostril daily.   It is important to stay hydrated: drink plenty of fluids (water, gatorade/powerade/pedialyte, juices, or teas) to keep your throat moisturized and help further relieve irritation/discomfort.

## 2023-05-08 ENCOUNTER — Ambulatory Visit: Payer: Medicare Other | Admitting: Dermatology

## 2023-05-08 VITALS — BP 125/79 | HR 88

## 2023-05-08 DIAGNOSIS — B009 Herpesviral infection, unspecified: Secondary | ICD-10-CM

## 2023-05-08 DIAGNOSIS — L570 Actinic keratosis: Secondary | ICD-10-CM | POA: Diagnosis not present

## 2023-05-08 DIAGNOSIS — W908XXA Exposure to other nonionizing radiation, initial encounter: Secondary | ICD-10-CM | POA: Diagnosis not present

## 2023-05-08 DIAGNOSIS — H0015 Chalazion left lower eyelid: Secondary | ICD-10-CM

## 2023-05-08 DIAGNOSIS — Z7189 Other specified counseling: Secondary | ICD-10-CM

## 2023-05-08 DIAGNOSIS — L814 Other melanin hyperpigmentation: Secondary | ICD-10-CM

## 2023-05-08 DIAGNOSIS — B001 Herpesviral vesicular dermatitis: Secondary | ICD-10-CM

## 2023-05-08 DIAGNOSIS — L820 Inflamed seborrheic keratosis: Secondary | ICD-10-CM | POA: Diagnosis not present

## 2023-05-08 DIAGNOSIS — L578 Other skin changes due to chronic exposure to nonionizing radiation: Secondary | ICD-10-CM | POA: Diagnosis not present

## 2023-05-08 DIAGNOSIS — L821 Other seborrheic keratosis: Secondary | ICD-10-CM

## 2023-05-08 MED ORDER — VALACYCLOVIR HCL 1 G PO TABS: 1000.0000 mg | ORAL_TABLET | Freq: Every day | ORAL | 11 refills | Status: AC

## 2023-05-08 NOTE — Patient Instructions (Addendum)
 Cryotherapy Aftercare  Wash gently with soap and water everyday.   Apply Vaseline and Band-Aid daily until healed.     Due to recent changes in healthcare laws, you may see results of your pathology and/or laboratory studies on MyChart before the doctors have had a chance to review them. We understand that in some cases there may be results that are confusing or concerning to you. Please understand that not all results are received at the same time and often the doctors may need to interpret multiple results in order to provide you with the best plan of care or course of treatment. Therefore, we ask that you please give us 2 business days to thoroughly review all your results before contacting the office for clarification. Should we see a critical lab result, you will be contacted sooner.   If You Need Anything After Your Visit  If you have any questions or concerns for your doctor, please call our main line at [phone] and press option 4 to reach your doctor's medical assistant. If no one answers, please leave a voicemail as directed and we will return your call as soon as possible. Messages left after 4 pm will be answered the following business day.   You may also send us a message via MyChart. We typically respond to MyChart messages within 1-2 business days.  For prescription refills, please ask your pharmacy to contact our office. Our fax number is [phone].  If you have an urgent issue when the clinic is closed that cannot wait until the next business day, you can page your doctor at the number below.    Please note that while we do our best to be available for urgent issues outside of office hours, we are not available 24/7.   If you have an urgent issue and are unable to reach us, you may choose to seek medical care at your doctor's office, retail clinic, urgent care center, or emergency room.  If you have a medical emergency, please immediately call 911 or go to the  emergency department.  Pager Numbers  - Dr. Kowalski: [phone]  - Dr. Moye: [phone]  - Dr. Stewart: [phone]  In the event of inclement weather, please call our main line at [phone] for an update on the status of any delays or closures.  Dermatology Medication Tips: Please keep the boxes that topical medications come in in order to help keep track of the instructions about where and how to use these. Pharmacies typically print the medication instructions only on the boxes and not directly on the medication tubes.   If your medication is too expensive, please contact our office at [phone] option 4 or send us a message through MyChart.   We are unable to tell what your co-pay for medications will be in advance as this is different depending on your insurance coverage. However, we may be able to find a substitute medication at lower cost or fill out paperwork to get insurance to cover a needed medication.   If a prior authorization is required to get your medication covered by your insurance company, please allow us 1-2 business days to complete this process.  Drug prices often vary depending on where the prescription is filled and some pharmacies may offer cheaper prices.  The website www.goodrx.com contains coupons for medications through different pharmacies. The prices here do not account for what the cost may be with help from insurance (it may be cheaper with your insurance), but the website can   give you the price if you did not use any insurance.  - You can print the associated coupon and take it with your prescription to the pharmacy.  - You may also stop by our office during regular business hours and pick up a GoodRx coupon card.  - If you need your prescription sent electronically to a different pharmacy, notify our office through [place] MyChart or by phone at [phone] option 4.     Si Usted Necesita Algo Despus de Su Visita  Tambin puede  enviarnos un mensaje a travs de MyChart. Por lo general respondemos a los mensajes de MyChart en el transcurso de 1 a 2 das hbiles.  Para renovar recetas, por favor pida a su farmacia que se ponga en contacto con nuestra oficina. Nuestro nmero de fax es el [phone].  Si tiene un asunto urgente cuando la clnica est cerrada y que no puede esperar hasta el siguiente da hbil, puede llamar/localizar a su doctor(a) al nmero que aparece a continuacin.   Por favor, tenga en cuenta que aunque hacemos todo lo posible para estar disponibles para asuntos urgentes fuera del horario de oficina, no estamos disponibles las 24 horas del da, los 7 das de la semana.   Si tiene un problema urgente y no puede comunicarse con nosotros, puede optar por buscar atencin mdica  en el consultorio de su doctor(a), en una clnica privada, en un centro de atencin urgente o en una sala de emergencias.  Si tiene una emergencia mdica, por favor llame inmediatamente al 911 o vaya a la sala de emergencias.  Nmeros de bper  - Dr. Kowalski: [phone]  - Dra. Moye: [phone]  - Dra. Stewart: [phone]  En caso de inclemencias del tiempo, por favor llame a nuestra lnea principal al [phone] para una actualizacin sobre el estado de cualquier retraso o cierre.  Consejos para la medicacin en dermatologa: Por favor, guarde las cajas en las que vienen los medicamentos de uso tpico para ayudarle a seguir las instrucciones sobre dnde y cmo usarlos. Las farmacias generalmente imprimen las instrucciones del medicamento slo en las cajas y no directamente en los tubos del medicamento.   Si su medicamento es muy caro, por favor, pngase en contacto con nuestra oficina llamando al [phone] y presione la opcin 4 o envenos un mensaje a travs de MyChart.   No podemos decirle cul ser su copago por los medicamentos por adelantado ya que esto es diferente dependiendo de la cobertura de su seguro.  Sin embargo, es posible que podamos encontrar un medicamento sustituto a menor costo o llenar un formulario para que el seguro cubra el medicamento que se considera necesario.   Si se requiere una autorizacin previa para que su compaa de seguros cubra su medicamento, por favor permtanos de 1 a 2 das hbiles para completar este proceso.  Los precios de los medicamentos varan con frecuencia dependiendo del lugar de dnde se surte la receta y alguna farmacias pueden ofrecer precios ms baratos.  El sitio web www.goodrx.com tiene cupones para medicamentos de diferentes farmacias. Los precios aqu no tienen en cuenta lo que podra costar con la ayuda del seguro (puede ser ms barato con su seguro), pero el sitio web puede darle el precio si no utiliz ningn seguro.  - Puede imprimir el cupn correspondiente y llevarlo con su receta a la farmacia.  - Tambin puede pasar por nuestra oficina durante el horario de atencin regular y recoger una tarjeta de cupones de GoodRx.  -   Si necesita que su receta se enve electrnicamente a una farmacia diferente, informe a nuestra oficina a travs de MyChart de [place] o por telfono llamando al [phone] y presione la opcin 4.  

## 2023-05-08 NOTE — Progress Notes (Signed)
Follow-Up Visit   Subjective  Heather Browning is a 67 y.o. female who presents for the following: Spots in the left ear that was noticed in March/April. Areas were flaky, sore, and had a history of bleeding. Since making the appointment, areas have improved, but still a little flaky, tender, and itchy. She has applied lotion. She has a history of fever blisters for years. She takes 1 gram valacyclovir daily for prevention. Over 4th of July, patient had a flare on her lip and had a similar blister come up on her left eye. She would like areas checked, also a refills of her med.    The following portions of the chart were reviewed this encounter and updated as appropriate: medications, allergies, medical history  Review of Systems:  No other skin or systemic complaints except as noted in HPI or Assessment and Plan.  Objective  Well appearing patient in no apparent distress; mood and affect are within normal limits.  A focused examination was performed of the following areas: Face, chest  Relevant physical exam findings are noted in the Assessment and Plan.  L ant ear helix x 1, L lower antihelix x 1, spinal upper back x 1 (3) Erythematous stuck-on, waxy papule  R forehead x 2, L upper forehead x 2, L mid forehead x 1, L eyebrow x 1, R lower cheek x 2, R malar cheek x 1, mid chest x 2 (adjacent to white scar), L chest x 1 (12) Pink scaly macules.   Left Mid Lower Mucosal Eyelid Tiny pink macule left mid lower mucosal eyelid.    Assessment & Plan   Inflamed seborrheic keratosis (3) L ant ear helix x 1, L lower antihelix x 1, spinal upper back x 1  vs Ak (L lower antihelix)   Destruction of lesion - L ant ear helix x 1, L lower antihelix x 1, spinal upper back x 1 (3)  Destruction method: cryotherapy   Informed consent: discussed and consent obtained   Lesion destroyed using liquid nitrogen: Yes   Region frozen until ice ball extended beyond lesion: Yes   Outcome: patient  tolerated procedure well with no complications   Post-procedure details: wound care instructions given   Additional details:  Prior to procedure, discussed risks of blister formation, small wound, skin dyspigmentation, or rare scar following cryotherapy. Recommend Vaseline ointment to treated areas while healing.   AK (actinic keratosis) (12) R forehead x 2, L upper forehead x 2, L mid forehead x 1, L eyebrow x 1, R lower cheek x 2, R malar cheek x 1, mid chest x 2 (adjacent to white scar), L chest x 1  vs ISKs (chest)  Actinic keratoses are precancerous spots that appear secondary to cumulative UV radiation exposure/sun exposure over time. They are chronic with expected duration over 1 year. A portion of actinic keratoses will progress to squamous cell carcinoma of the skin. It is not possible to reliably predict which spots will progress to skin cancer and so treatment is recommended to prevent development of skin cancer.  Recommend daily broad spectrum sunscreen SPF 30+ to sun-exposed areas, reapply every 2 hours as needed.  Recommend staying in the shade or wearing long sleeves, sun glasses (UVA+UVB protection) and wide brim hats (4-inch brim around the entire circumference of the hat). Call for new or changing lesions.  Destruction of lesion - R forehead x 2, L upper forehead x 2, L mid forehead x 1, L eyebrow x 1, R lower cheek  x 2, R malar cheek x 1, mid chest x 2 (adjacent to white scar), L chest x 1 (12)  Destruction method: cryotherapy   Informed consent: discussed and consent obtained   Lesion destroyed using liquid nitrogen: Yes   Region frozen until ice ball extended beyond lesion: Yes   Outcome: patient tolerated procedure well with no complications   Post-procedure details: wound care instructions given   Additional details:  Prior to procedure, discussed risks of blister formation, small wound, skin dyspigmentation, or rare scar following cryotherapy. Recommend Vaseline  ointment to treated areas while healing.   Chalazion of left lower eyelid Left Mid Lower Mucosal Eyelid  Resolved.   If recurs, recommend patient see eye doctor. May add warm compresses.   ACTINIC DAMAGE WITH PRECANCEROUS ACTINIC KERATOSES Counseling for Topical Chemotherapy Management: Patient exhibits: - Severe, confluent actinic changes with pre-cancerous actinic keratoses that is secondary to cumulative UV radiation exposure over time - Condition that is severe; chronic, not at goal. - diffuse scaly erythematous macules and papules with underlying dyspigmentation - Discussed Prescription "Field Treatment" topical Chemotherapy for Severe, Chronic Confluent Actinic Changes with Pre-Cancerous Actinic Keratoses Field treatment involves treatment of an entire area of skin that has confluent Actinic Changes (Sun/ Ultraviolet light damage) and PreCancerous Actinic Keratoses by method of PhotoDynamic Therapy (PDT) and/or prescription Topical Chemotherapy agents such as 5-fluorouracil, 5-fluorouracil/calcipotriene, and/or imiquimod.  The purpose is to decrease the number of clinically evident and subclinical PreCancerous lesions to prevent progression to development of skin cancer by chemically destroying early precancer changes that may or may not be visible.  It has been shown to reduce the risk of developing skin cancer in the treated area. As a result of treatment, redness, scaling, crusting, and open sores may occur during treatment course. One or more than one of these methods may be used and may have to be used several times to control, suppress and eliminate the PreCancerous changes. Discussed treatment course, expected reaction, and possible side effects. - Recommend daily broad spectrum sunscreen SPF 30+ to sun-exposed areas, reapply every 2 hours as needed.  - Staying in the shade or wearing long sleeves, sun glasses (UVA+UVB protection) and wide brim hats (4-inch brim around the entire  circumference of the hat) are also recommended. - Call for new or changing lesions. - Recommend red light PDT with debridement to face.  Patient will schedule.    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 Currently well-controlled.  Needs rfs.   Herpes Simplex Virus = Cold Sores = Fever Blisters is a chronic recurring blistering; scabbing sore-producing viral infection that is recurrent usually in the same area triggered by stress, sun/UV exposure and trauma.  It is infectious and can be spread from person to person by direct contact.  It is not curable, but is treatable with topical and oral medication.  Treatment Plan Take Valacyclovir 1 gram every day dsp #30 1 yr Rf.  Suppressive dose.  SEBORRHEIC KERATOSIS - Stuck-on, waxy, tan-brown papules and/or plaques  - Benign-appearing - Discussed benign etiology and prognosis. - Observe - Call for any changes  LENTIGINES Exam: scattered tan macules Due to sun exposure Treatment Plan: Benign-appearing, observe. Recommend daily broad spectrum sunscreen SPF 30+ to sun-exposed areas, reapply every 2 hours as needed.  Call for any changes.   Return for Red light with debridement to face in the fall, 6 mos for TBSE.  Wendee Beavers, CMA, am  acting as scribe for Willeen Niece, MD .   Documentation: I have reviewed the above documentation for accuracy and completeness, and I agree with the above.  Willeen Niece, MD

## 2023-05-15 ENCOUNTER — Ambulatory Visit: Payer: BC Managed Care – PPO | Admitting: Dermatology

## 2023-07-16 ENCOUNTER — Ambulatory Visit (INDEPENDENT_AMBULATORY_CARE_PROVIDER_SITE_OTHER): Payer: Medicare Other | Admitting: Dermatology

## 2023-07-16 DIAGNOSIS — L570 Actinic keratosis: Secondary | ICD-10-CM | POA: Diagnosis not present

## 2023-07-16 MED ORDER — AMINOLEVULINIC ACID HCL 10 % EX GEL
2000.0000 mg | Freq: Once | CUTANEOUS | Status: AC
  Administered 2023-07-16: 2000 mg via TOPICAL

## 2023-07-16 NOTE — Progress Notes (Signed)
Patient completed red light phototherapy with debridement today.  ACTINIC KERATOSES Exam: Erythematous thin papules/macules with gritty scale.  Treatment Plan:  Red Light Photodynamic therapy  Procedure discussed: discussed risks, benefits, side effects. and alternatives   Prep: site scrubbed/prepped with acetone   Debridement needed: Yes (performed by Physician with sand paper.  (CPT C5184948)) Location:  face Number of lesions:  Multiple (> 15) Type of treatment:  Red light Aminolevulinic Acid (see MAR for details): Ameluz Aminolevulinic Acid comment:  [id number] Amount of Ameluz (mg):  1 Incubation time (minutes):  60 Number of minutes under lamp:  20 Cooling:  Fan Outcome: patient tolerated procedure well with no complications   Post-procedure details: sunscreen applied and aftercare instructions given to patient    Related Medications Aminolevulinic Acid HCl 10 % GEL 2,000 mg  [place] Desanctis, RMA  I personally debrided area prior to application of aminolevulinic acid Willeen Niece MD  Documentation: I have reviewed the above documentation for accuracy and completeness, and I agree with the above.  Willeen Niece MD  [facility]-NURSE ROOM

## 2023-07-16 NOTE — Patient Instructions (Signed)

## 2023-11-18 ENCOUNTER — Ambulatory Visit (INDEPENDENT_AMBULATORY_CARE_PROVIDER_SITE_OTHER): Payer: Medicare Other | Admitting: Dermatology

## 2023-11-18 DIAGNOSIS — L821 Other seborrheic keratosis: Secondary | ICD-10-CM

## 2023-11-18 DIAGNOSIS — C44612 Basal cell carcinoma of skin of right upper limb, including shoulder: Secondary | ICD-10-CM

## 2023-11-18 DIAGNOSIS — L820 Inflamed seborrheic keratosis: Secondary | ICD-10-CM | POA: Diagnosis not present

## 2023-11-18 DIAGNOSIS — D229 Melanocytic nevi, unspecified: Secondary | ICD-10-CM

## 2023-11-18 DIAGNOSIS — L578 Other skin changes due to chronic exposure to nonionizing radiation: Secondary | ICD-10-CM | POA: Diagnosis not present

## 2023-11-18 DIAGNOSIS — W908XXA Exposure to other nonionizing radiation, initial encounter: Secondary | ICD-10-CM | POA: Diagnosis not present

## 2023-11-18 DIAGNOSIS — L814 Other melanin hyperpigmentation: Secondary | ICD-10-CM

## 2023-11-18 DIAGNOSIS — L570 Actinic keratosis: Secondary | ICD-10-CM | POA: Diagnosis not present

## 2023-11-18 DIAGNOSIS — Z85828 Personal history of other malignant neoplasm of skin: Secondary | ICD-10-CM

## 2023-11-18 DIAGNOSIS — D492 Neoplasm of unspecified behavior of bone, soft tissue, and skin: Secondary | ICD-10-CM | POA: Diagnosis not present

## 2023-11-18 DIAGNOSIS — Z86018 Personal history of other benign neoplasm: Secondary | ICD-10-CM

## 2023-11-18 DIAGNOSIS — Z1283 Encounter for screening for malignant neoplasm of skin: Secondary | ICD-10-CM | POA: Diagnosis not present

## 2023-11-18 DIAGNOSIS — C4491 Basal cell carcinoma of skin, unspecified: Secondary | ICD-10-CM

## 2023-11-18 DIAGNOSIS — D225 Melanocytic nevi of trunk: Secondary | ICD-10-CM

## 2023-11-18 DIAGNOSIS — D1801 Hemangioma of skin and subcutaneous tissue: Secondary | ICD-10-CM

## 2023-11-18 DIAGNOSIS — D485 Neoplasm of uncertain behavior of skin: Secondary | ICD-10-CM

## 2023-11-18 HISTORY — DX: Basal cell carcinoma of skin, unspecified: C44.91

## 2023-11-18 HISTORY — DX: Actinic keratosis: L57.0

## 2023-11-18 NOTE — Progress Notes (Unsigned)
Follow-Up Visit   Subjective  Heather Browning is a 68 y.o. female who presents for the following: Skin Cancer Screening and Full Body Skin Exam  The patient presents for Total-Body Skin Exam (TBSE) for skin cancer screening and mole check. The patient has spots, moles and lesions to be evaluated, some may be new or changing and the patient may have concern these could be cancer. She has a spot on her right forehead, "touchy"- previously frozen couple times, and an irritated spot on the left cheek. PDT treatment to the face 06/2023 and chest treatment 2023. History of BCC tx in the past.    The following portions of the chart were reviewed this encounter and updated as appropriate: medications, allergies, medical history  Review of Systems:  No other skin or systemic complaints except as noted in HPI or Assessment and Plan.  Objective  Well appearing patient in no apparent distress; mood and affect are within normal limits.  A full examination was performed including scalp, head, eyes, ears, nose, lips, neck, chest, axillae, abdomen, back, buttocks, bilateral upper extremities, bilateral lower extremities, hands, feet, fingers, toes, fingernails, and toenails. All findings within normal limits unless otherwise noted below.   Relevant physical exam findings are noted in the Assessment and Plan.  Right Forehead 4.0 mm pink scaly macule    Right Upper Arm 5.0 mm pink pearly papule   Left cheek x 1, Right lateral knee x 1 (2) Erythematous stuck-on, waxy papule  Assessment & Plan   SKIN CANCER SCREENING PERFORMED TODAY.  ACTINIC DAMAGE - Chronic condition, secondary to cumulative UV/sun exposure - diffuse scaly erythematous macules with underlying dyspigmentation - Recommend daily broad spectrum sunscreen SPF 30+ to sun-exposed areas, reapply every 2 hours as needed.  - Staying in the shade or wearing long sleeves, sun glasses (UVA+UVB protection) and wide brim hats (4-inch brim  around the entire circumference of the hat) are also recommended for sun protection.  - Call for new or changing lesions.  LENTIGINES, SEBORRHEIC KERATOSES, HEMANGIOMAS - Benign normal skin lesions - Benign-appearing - Call for any changes  MELANOCYTIC NEVI - Tan-brown and/or pink-flesh-colored symmetric macules and papules - 4.0 x 3.0 mm med brown macule R lower abdomen - Benign appearing on exam today - Observation - Call clinic for new or changing moles - Recommend daily use of broad spectrum spf 30+ sunscreen to sun-exposed areas.   HISTORY OF BASAL CELL CARCINOMA OF THE SKIN Right lateral shoulder, 2000, tx in [place] - No evidence of recurrence today - Recommend regular full body skin exams - Recommend daily broad spectrum sunscreen SPF 30+ to sun-exposed areas, reapply every 2 hours as needed.  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  - Call if any new or changing lesions are noted between office visits  NEOPLASM OF UNCERTAIN BEHAVIOR OF SKIN (2) Right Forehead Epidermal / dermal shaving  Lesion diameter (cm):  0.5 Informed consent: discussed and consent obtained   Patient was prepped and draped in usual sterile fashion: area prepped with alcohol. Anesthesia: the lesion was anesthetized in a standard fashion   Anesthetic:  1% lidocaine w/ epinephrine 1-100,000 buffered w/ 8.4% NaHCO3 Instrument used: flexible  razor blade   Hemostasis achieved with: pressure, aluminum chloride and electrodesiccation   Outcome: patient tolerated procedure well   Post-procedure details: wound care instructions given   Post-procedure details comment:  Ointment and small bandage  applied Specimen 1 - Surgical pathology Differential Diagnosis: AK vs ISK r/o SCC IS Check Margins: Yes  Right Upper Arm Epidermal / dermal shaving  Lesion diameter (cm):  0.9 Informed consent: discussed and consent obtained   Patient was prepped and draped in usual sterile fashion: Area prepped with alcohol. Anesthesia: the lesion was anesthetized in a standard fashion   Anesthetic:  1% lidocaine w/ epinephrine 1-100,000 buffered w/ 8.4% NaHCO3 Instrument used: flexible razor blade   Hemostasis achieved with: pressure, aluminum chloride and electrodesiccation   Outcome: patient tolerated procedure well    Destruction of lesion  Destruction method: electrodesiccation and curettage   Informed consent: discussed and consent obtained   Curettage performed in three different directions: Yes   Electrodesiccation performed over the curetted area: Yes   Final wound size (cm):  0.9 Hemostasis achieved with:  pressure, aluminum chloride and electrodesiccation Outcome: patient tolerated procedure well with no complications   Post-procedure details: wound care instructions given   Post-procedure details comment:  Ointment and bandage applied. Specimen 2 - Surgical pathology Differential Diagnosis: r/o BCC Check Margins: No EDC today Discussed resulting small scar with shave removal, and possible recurrence of lesion.  Recommend vaseline ointment to area daily and cover until healed.  Recommend photoprotection/sunscreen to area to prevent discoloration of scar.  Once healed, may apply OTC Serica scar gel bid to thickened scars.  AK (ACTINIC KERATOSIS) (8) R lower cheek x 1, R zygoma x 1, L mid cheek x 1, L lower antihelix x 1, R shoulder x 3, R hand dorsum x 1 (8) Actinic keratoses are precancerous spots that appear secondary to cumulative UV radiation exposure/sun exposure over time. They are chronic with expected duration over 1 year. A portion of actinic keratoses will progress to squamous  cell carcinoma of the skin. It is not possible to reliably predict which spots will progress to skin cancer and so treatment is recommended to prevent development of skin cancer.  Recommend daily broad spectrum sunscreen SPF 30+ to sun-exposed areas, reapply every 2 hours as needed.  Recommend staying in the shade or wearing long sleeves, sun glasses (UVA+UVB protection) and wide brim hats (4-inch brim around the entire circumference of the hat). Call for new or changing lesions. Destruction of lesion - R lower cheek x 1, R zygoma x 1, L mid cheek x 1, L lower antihelix x 1, R shoulder x 3, R hand dorsum x 1 (8)  Destruction method: cryotherapy   Informed consent: discussed and consent obtained   Lesion destroyed using liquid nitrogen: Yes   Region frozen until ice ball extended beyond lesion: Yes   Outcome: patient tolerated procedure well with no complications   Post-procedure details: wound care instructions given   Additional details:  Prior to procedure, discussed risks of blister formation, small wound, skin dyspigmentation, or rare scar following cryotherapy. Recommend Vaseline ointment to treated areas while healing.  INFLAMED SEBORRHEIC KERATOSIS (2) Left cheek x 1, Right lateral knee x 1 (2) Symptomatic, irritating, patient would like treated. Destruction of lesion - Left cheek x 1, Right lateral knee x 1 (2)  Destruction method: cryotherapy   Informed consent: discussed and consent obtained   Lesion destroyed using liquid nitrogen: Yes   Region frozen until ice ball extended beyond  lesion: Yes   Outcome: patient tolerated procedure well with no complications   Post-procedure details: wound care instructions given   Additional details:  Prior to procedure, discussed risks of blister formation, small wound, skin dyspigmentation, or rare scar following cryotherapy. Recommend Vaseline ointment to treated areas while healing.  Return in about 6 months (around 05/17/2024) for Hx BCC,  1 yr TBSE.  ICherlyn Labella, CMA, am acting as scribe for Willeen Niece, MD .   Documentation: I have reviewed the above documentation for accuracy and completeness, and I agree with the above.  Willeen Niece, MD

## 2023-11-18 NOTE — Patient Instructions (Addendum)

## 2023-11-19 LAB — SURGICAL PATHOLOGY

## 2023-11-20 ENCOUNTER — Telehealth: Payer: Self-pay

## 2023-11-20 NOTE — Telephone Encounter (Signed)
Left pt msg to call for bx results/sh

## 2023-11-20 NOTE — Telephone Encounter (Signed)
-----   Message from Willeen Niece sent at 11/19/2023  5:57 PM EST ----- 1. 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 Skin, right upper arm :       SUPERFICIAL AND NODULAR BASAL CELL CARCINOMA   1. Thick precancer with features of wart, will need cryotherapy if it recurs 2. BCC skin cancer- already treated with EDC at time of biopsy   - please call patient

## 2023-11-21 ENCOUNTER — Telehealth: Payer: Self-pay

## 2023-11-21 NOTE — Telephone Encounter (Signed)
-----   Message from Willeen Niece sent at 11/19/2023  5:57 PM EST ----- 1. 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 Skin, right upper arm :       SUPERFICIAL AND NODULAR BASAL CELL CARCINOMA   1. Thick precancer with features of wart, will need cryotherapy if it recurs 2. BCC skin cancer- already treated with EDC at time of biopsy   - please call patient

## 2023-11-21 NOTE — Telephone Encounter (Signed)
Discussed biopsy results with patient

## 2023-11-21 NOTE — Telephone Encounter (Signed)
Left pt another message to call for bx result/sh

## 2024-03-06 ENCOUNTER — Ambulatory Visit (INDEPENDENT_AMBULATORY_CARE_PROVIDER_SITE_OTHER): Admitting: Podiatry

## 2024-03-06 ENCOUNTER — Ambulatory Visit (INDEPENDENT_AMBULATORY_CARE_PROVIDER_SITE_OTHER)

## 2024-03-06 ENCOUNTER — Encounter: Payer: Self-pay | Admitting: Podiatry

## 2024-03-06 DIAGNOSIS — M65971 Unspecified synovitis and tenosynovitis, right ankle and foot: Secondary | ICD-10-CM | POA: Diagnosis not present

## 2024-03-06 DIAGNOSIS — M778 Other enthesopathies, not elsewhere classified: Secondary | ICD-10-CM | POA: Diagnosis not present

## 2024-03-06 MED ORDER — MELOXICAM 15 MG PO TABS: 15.0000 mg | ORAL_TABLET | Freq: Every day | ORAL | 1 refills | Status: DC

## 2024-03-06 MED ORDER — METHYLPREDNISOLONE 4 MG PO TBPK: ORAL_TABLET | ORAL | 0 refills | Status: DC

## 2024-03-06 MED ORDER — BETAMETHASONE SOD PHOS & ACET 6 (3-3) MG/ML IJ SUSP
3.0000 mg | Freq: Once | INTRAMUSCULAR | Status: AC
  Administered 2024-03-06: 3 mg via INTRA_ARTICULAR

## 2024-03-06 NOTE — Progress Notes (Signed)
 Chief Complaint  Patient presents with   Foot Pain    "I have pain below this long toe and the toe next to it." N - pain below toes L - 2nd and 3rd met. D - January 2025 O - suddenly, slightly better C - sharp pain, ache, throbs, toes have separated A - put weight on it T - put a felt pad on the ball of foot, changing shoes     HPI: 68 y.o. female presenting today as a new patient for evaluation of pain and tenderness to the second MTP of the right foot ongoing for 4-5 months now.  Idiopathic gradual onset.  She believes that she may have injured her foot when hitting it against some furniture.  It has been painful and tender ever since.  Past Medical History:  Diagnosis Date   Actinic keratosis 11/18/2023   Right Forehead   Basal cell carcinoma    multiple treated at derm in [place]    BCC (basal cell carcinoma) 11/18/2023   Right Upper Arm, EDC   Dysplastic nevus 02/19/2022   R upper inner arm, mod-severe, shave removal 04/09/2022   Dysplastic nevus 02/19/2022   R lower back, severe atypia, Excised 04/02/2022   Shingles    Skin cancer    Squamous cell carcinoma of skin 2017   face   Vitamin D  deficiency 12/25/2017    Past Surgical History:  Procedure Laterality Date   ABDOMINAL HYSTERECTOMY     pt has one ovary left   TONSILLECTOMY      Allergies  Allergen Reactions   Macrobid [Nitrofurantoin Macrocrystal] Hives   Morphine And Codeine Hives     Physical Exam: General: The patient is alert and oriented x3 in no acute distress.  Dermatology: Skin is warm, dry and supple bilateral lower extremities.   Vascular: Palpable pedal pulses bilaterally. Capillary refill within normal limits.  No appreciable edema.  No erythema.  Neurological: Grossly intact via light touch  Musculoskeletal Exam: Hallux valgus deformity with hammertoe of the second digit bilateral.  Significant pain and tenderness with palpation range of motion of second MTP of the right foot and to a  lesser extent the third MTP  Radiographic Exam RT foot 03/06/2024:  Normal osseous mineralization. Joint spaces preserved.  Mild hallux valgus deformity noted.  Elongated second metatarsal with medial deviation of the toe at the level of the MTP  Assessment/Plan of Care: 1.  Second MTP capsulitis right 2.  Hallux valgus with hammertoe deformity second digit bilateral 3.  Plantarflexed elongated second metatarsal right  -Patient evaluated.  X-rays reviewed -Injection of 0.5 cc Celestone Soluspan injected in the second MTP right -Prescription for Medrol Dosepak -Prescription for meloxicam 15 mg daily after completion of the Dosepak -Recommend shoes that are thicker in the sole and do not allow much bend in the forefoot.  Advised against going barefoot.  Recommended OOFOS slides at home -Offloading felt quarter-inch metatarsal pads were applied to the insoles the patient's shoe to offload pressure from the forefoot.  Additional pads were provided -Return to clinic 4 weeks      Heather Browning, DPM [facility]  Dr. Dot Browning, DPM    [address], [place] [address]  Office [phone]  Fax [phone]

## 2024-03-09 ENCOUNTER — Encounter: Payer: Self-pay | Admitting: Podiatry

## 2024-04-03 ENCOUNTER — Encounter: Payer: Self-pay | Admitting: Podiatry

## 2024-04-03 ENCOUNTER — Ambulatory Visit (INDEPENDENT_AMBULATORY_CARE_PROVIDER_SITE_OTHER): Admitting: Podiatry

## 2024-04-03 ENCOUNTER — Ambulatory Visit (INDEPENDENT_AMBULATORY_CARE_PROVIDER_SITE_OTHER)

## 2024-04-03 VITALS — Ht 67.0 in | Wt 140.0 lb

## 2024-04-03 DIAGNOSIS — M2012 Hallux valgus (acquired), left foot: Secondary | ICD-10-CM | POA: Diagnosis not present

## 2024-04-03 DIAGNOSIS — M21612 Bunion of left foot: Secondary | ICD-10-CM | POA: Diagnosis not present

## 2024-04-03 DIAGNOSIS — M21619 Bunion of unspecified foot: Secondary | ICD-10-CM

## 2024-04-03 NOTE — Progress Notes (Signed)
 Chief Complaint  Patient presents with   Foot Pain    Pt is here to f/u on right foot and ankle pain, she states her foot is a lot better, wants to know if she should continue meloxicam  or discontinue.    Subjective: 68 y.o. female presents today for follow-up of second MTP capsulitis to the right foot.  Patient doing much better.  She normally has any pain or tenderness  New complaint today regarding bunion to the left foot.  She has a chronic history of bunion deformity which runs in her family.  She recently went on a vacation and noticed an acute flareup of bunion pain.  She tries to wear different shoes but she continues to have pain and tenderness  Past Medical History:  Diagnosis Date   Actinic keratosis 11/18/2023   Right Forehead   Basal cell carcinoma    multiple treated at derm in [place]    BCC (basal cell carcinoma) 11/18/2023   Right Upper Arm, EDC   Dysplastic nevus 02/19/2022   R upper inner arm, mod-severe, shave removal 04/09/2022   Dysplastic nevus 02/19/2022   R lower back, severe atypia, Excised 04/02/2022   Shingles    Skin cancer    Squamous cell carcinoma of skin 2017   face   Vitamin D  deficiency 12/25/2017    Past Surgical History:  Procedure Laterality Date   ABDOMINAL HYSTERECTOMY     pt has one ovary left   TONSILLECTOMY      Allergies  Allergen Reactions   Macrobid [Nitrofurantoin Macrocrystal] Hives   Morphine And Codeine Hives     Objective: Physical Exam General: The patient is alert and oriented x3 in no acute distress.  Dermatology: Skin is cool, dry and supple bilateral lower extremities. Negative for open lesions or macerations.  Vascular: Palpable pedal pulses bilaterally. No edema or erythema noted. Capillary refill within normal limits.  Neurological: Grossly intact via light touch.   Musculoskeletal Exam: Clinical evidence of bunion deformity noted to the respective foot. There is moderate pain on palpation range of  motion of the first MPJ. Lateral deviation of the hallux noted consistent with hallux abductovalgus.  Radiographic Exam LT foot 04/03/2024: Normal osseous mineralization.  No acute fractures identified.  Increased intermetatarsal angle greater than 15 with a hallux abductus angle greater than 30 noted on AP view.   Assessment: 1.  Hallux valgus bilateral. LT > RT with overlapping second toe deformity   Plan of Care:  -Patient was evaluated. X-Rays reviewed. -Today we discussed the pathology and etiology of bunion deformity.  Discussed also conservative versus surgical management.  Ultimately the patient opts for surgical management because conservative treatment including anti-inflammatory and shoe gear modifications have not alleviated her symptoms or corrected bunion deformity which is progressive -Surgery was explained in detail to the patient.  The patient would benefit from Lapidus type bunionectomy to the left foot.  The procedure was explained as well as postoperative recovery course.  All patient questions were answered. -The patient would like to have surgery towards the beginning of next year.  Return to clinic fall for surgical consult   Heather Browning, DPM [facility]  Dr. Dot Browning, DPM    [address]                                       [place], [place]  [address]                Office [phone]  Fax [phone]

## 2024-05-10 ENCOUNTER — Other Ambulatory Visit: Payer: Self-pay | Admitting: Podiatry

## 2024-05-19 ENCOUNTER — Ambulatory Visit: Payer: Medicare Other | Admitting: Dermatology

## 2024-05-19 DIAGNOSIS — L821 Other seborrheic keratosis: Secondary | ICD-10-CM

## 2024-05-19 DIAGNOSIS — L578 Other skin changes due to chronic exposure to nonionizing radiation: Secondary | ICD-10-CM

## 2024-05-19 DIAGNOSIS — L570 Actinic keratosis: Secondary | ICD-10-CM | POA: Diagnosis not present

## 2024-05-19 DIAGNOSIS — L820 Inflamed seborrheic keratosis: Secondary | ICD-10-CM

## 2024-05-19 DIAGNOSIS — W908XXA Exposure to other nonionizing radiation, initial encounter: Secondary | ICD-10-CM

## 2024-05-19 DIAGNOSIS — Z85828 Personal history of other malignant neoplasm of skin: Secondary | ICD-10-CM

## 2024-05-19 DIAGNOSIS — L814 Other melanin hyperpigmentation: Secondary | ICD-10-CM

## 2024-05-19 DIAGNOSIS — Z872 Personal history of diseases of the skin and subcutaneous tissue: Secondary | ICD-10-CM

## 2024-05-19 NOTE — Progress Notes (Signed)
 Follow-Up Visit   Subjective  Heather Browning is a 68 y.o. female who presents for the following: 6 month follow-up hx BCCs and AKs. Patient also has a few spots to check on the bilateral ears, upper back (itchy), and abdomen.  The patient presents for Upper Body Skin Exam (UBSE) for skin cancer screening and mole check.  The patient has spots, moles and lesions to be evaluated, some may be new or changing and the patient has concerns that these could be cancer.  The following portions of the chart were reviewed this encounter and updated as appropriate: medications, allergies, medical history  Review of Systems:  No other skin or systemic complaints except as noted in HPI or Assessment and Plan.  Objective  Well appearing patient in no apparent distress; mood and affect are within normal limits.  All areas on upper body examined today. All findings within normal limits unless otherwise noted below.   Relevant physical exam findings are noted in the Assessment and Plan.  R forehead x 1, L mid ear helix x 1 (2) Pink scaly macules. spinal upper back (16) Erythematous stuck-on, waxy papule  Assessment & Plan    ACTINIC DAMAGE - Chronic condition, secondary to cumulative UV/sun exposure - diffuse scaly erythematous macules with underlying dyspigmentation - Recommend daily broad spectrum sunscreen SPF 30+ to sun-exposed areas, reapply every 2 hours as needed.  - Staying in the shade or wearing long sleeves, sun glasses (UVA+UVB protection) and wide brim hats (4-inch brim around the entire circumference of the hat) are also recommended for sun protection.  - Call for new or changing lesions.  SEBORRHEIC KERATOSIS - Stuck-on, waxy, tan-brown papules and/or plaques, including right abdomen and right upper arm  - Benign-appearing - Discussed benign etiology and prognosis. - Observe - Call for any changes  LENTIGINES Exam: scattered tan macules Due to sun exposure Treatment  Plan: Benign-appearing, observe. Recommend daily broad spectrum sunscreen SPF 30+ to sun-exposed areas, reapply every 2 hours as needed.  Call for any changes   HISTORY OF BASAL CELL CARCINOMA OF THE SKIN Right upper arm, EDC 11/18/2023 Multiple, see History - No evidence of recurrence today - Recommend regular full body skin exams - Recommend daily broad spectrum sunscreen SPF 30+ to sun-exposed areas, reapply every 2 hours as needed.  - Call if any new or changing lesions are noted between office visits  HISTORY OF PRECANCEROUS ACTINIC KERATOSIS - biopsy proven at the right forehead. Clear today.  - site(s) of PreCancerous Actinic Keratosis clear today. - these may recur and new lesions may form requiring treatment to prevent transformation into skin cancer - observe for new or changing spots and contact [facility] for appointment if occur - photoprotection with sun protective clothing; sunglasses and broad spectrum sunscreen with SPF of at least 30 + and frequent self skin exams recommended - yearly exams by a dermatologist recommended for persons with history of PreCancerous Actinic Keratoses    AK (ACTINIC KERATOSIS) (2) R forehead x 1, L mid ear helix x 1 (2) Actinic keratoses are precancerous spots that appear secondary to cumulative UV radiation exposure/sun exposure over time. They are chronic with expected duration over 1 year. A portion of actinic keratoses will progress to squamous cell carcinoma of the skin. It is not possible to reliably predict which spots will progress to skin cancer and so treatment is recommended to prevent development of skin cancer.  Recommend daily broad spectrum sunscreen SPF 30+ to sun-exposed areas, reapply  every 2 hours as needed.  Recommend staying in the shade or wearing long sleeves, sun glasses (UVA+UVB protection) and wide brim hats (4-inch brim around the entire circumference of the hat). Call for new or changing  lesions. Destruction of lesion - R forehead x 1, L mid ear helix x 1 (2)  Destruction method: cryotherapy   Informed consent: discussed and consent obtained   Lesion destroyed using liquid nitrogen: Yes   Region frozen until ice ball extended beyond lesion: Yes   Outcome: patient tolerated procedure well with no complications   Post-procedure details: wound care instructions given   Additional details:  Prior to procedure, discussed risks of blister formation, small wound, skin dyspigmentation, or rare scar following cryotherapy. Recommend Vaseline ointment to treated areas while healing.   INFLAMED SEBORRHEIC KERATOSIS (16) spinal upper back (16) Symptomatic, irritating, patient would like treated. Destruction of lesion - spinal upper back (16)  Destruction method: cryotherapy   Informed consent: discussed and consent obtained   Lesion destroyed using liquid nitrogen: Yes   Region frozen until ice ball extended beyond lesion: Yes   Outcome: patient tolerated procedure well with no complications   Post-procedure details: wound care instructions given   Additional details:  Prior to procedure, discussed risks of blister formation, small wound, skin dyspigmentation, or rare scar following cryotherapy. Recommend Vaseline ointment to treated areas while healing.   Return as scheduled, for TBSE, Hx BCC, Hx AKs, Hx Dysplastic Nevus.  IAndrea Kerns, CMA, am acting as scribe for Rexene Rattler, MD .   Documentation: I have reviewed the above documentation for accuracy and completeness, and I agree with the above.  Rexene Rattler, MD

## 2024-05-19 NOTE — Patient Instructions (Addendum)

## 2024-06-05 NOTE — Progress Notes (Signed)
 Sleep Medicine   Office Visit  Patient Name: Heather Browning DOB: 1956-01-13 MRN [id number]    Chief Complaint: fatigue/daytime sleepiness  Brief History:  Heather Browning presents for an initial consult for sleep evaluation and to establish care. Patient has at least  8 month history of excessive fatigue and daytime sleepiness. Sleep quality is poor. This is noted every night. The patient's bed partner reports snoring and sleep chewing at night. The patient relates the following symptoms: headaches, fatigue, trouble concentrating, and brain fog are also present. The patient goes to sleep at 1000 pm and wakes up at 0800 am and will wake up at least twice in between with trouble returning to sleep.  Sleep quality is the same when outside home environment.  Patient has noted no significant movement of her legs at night that would disrupt her sleep.  The patient  relates very vivid dreams and occasional nightmares and chewing type activity as unusual behavior during the night.  The patient relates  no history of psychiatric problems. The Epworth Sleepiness Score is 19 out of 24 .  The patient relates  Cardiovascular risk factors include: none.  The patient reports rumination that inhibits her ability to fall asleep. She also reports that she has a great difficulty in finding a comfortable position to fall asleep, there is some degree of restlessness. PHQ-9 6 score  ROS  General: (-) fever, (-) chills, (-) night sweat Nose and Sinuses: (-) nasal stuffiness or itchiness, (-) postnasal drip, (-) nosebleeds, (-) sinus trouble. Mouth and Throat: (-) sore throat, (-) hoarseness. Neck: (-) swollen glands, (-) enlarged thyroid , (-) neck pain. Respiratory: - cough, - shortness of breath, - wheezing. Neurologic: - numbness, - tingling. Psychiatric: - anxiety, - depression Sleep behavior: -sleep paralysis -hypnogogic hallucinations -dream enactment      -vivid dreams -cataplexy -night terrors -sleep  walking   Current Medication: Outpatient Encounter Medications as of 06/08/2024  Medication Sig   Cholecalciferol (D3 2000 PO) Take by mouth.   cyanocobalamin (VITAMIN B12) 1000 MCG tablet Take 1,000 mcg by mouth daily.   Multiple Vitamin (MULTIVITAMIN) tablet Take 1 tablet by mouth daily.   meloxicam  (MOBIC ) 15 MG tablet TAKE 1 TABLET BY MOUTH EVERY DAY   valACYclovir  (VALTREX ) 1000 MG tablet Take 1 tablet (1,000 mg total) by mouth daily.   No facility-administered encounter medications on file as of 06/08/2024.    Surgical History: Past Surgical History:  Procedure Laterality Date   ABDOMINAL HYSTERECTOMY     pt has one ovary left   TONSILLECTOMY      Medical History: Past Medical History:  Diagnosis Date   Actinic keratosis 11/18/2023   Right Forehead   Basal cell carcinoma    multiple treated at derm in [place]    BCC (basal cell carcinoma) 11/18/2023   Right Upper Arm, EDC   Dysplastic nevus 02/19/2022   R upper inner arm, mod-severe, shave removal 04/09/2022   Dysplastic nevus 02/19/2022   R lower back, severe atypia, Excised 04/02/2022   Shingles    Skin cancer    Squamous cell carcinoma of skin 2017   face   Vitamin D  deficiency 12/25/2017    Family History: Non contributory to the present illness  Social History: Social History   Socioeconomic History   Marital status: Married    Spouse name: Not on file   Number of children: Not on file   Years of education: Not on file   Highest education level: Not on file  Occupational History  Not on file  Tobacco Use   Smoking status: Never    Passive exposure: Never   Smokeless tobacco: Never  Vaping Use   Vaping status: Never Used  Substance and Sexual Activity   Alcohol use: Not Currently    Comment: once a month/ wine   Drug use: No   Sexual activity: Not Currently  Other Topics Concern   Not on file  Social History Narrative   Not on file   Social Drivers of Health   Financial Resource  Strain: Not on file  Food Insecurity: No Food Insecurity (01/16/2024)   Received from [facility]   Hunger Vital Sign    Within the past 12 months, you worried that your food would run out before you got the money to buy more.: Never true    Within the past 12 months, the food you bought just didn't last and you didn't have money to get more.: Never true  Transportation Needs: No Transportation Needs (01/16/2024)   Received from [facility]   PRAPARE - Transportation    Lack of Transportation (Medical): No    Lack of Transportation (Non-Medical): No  Physical Activity: Not on file  Stress: Not on file  Social Connections: Not on file  Intimate Partner Violence: Not on file    Vital Signs: Blood pressure (!) 148/87, pulse 78, resp. rate 16, height 5' 7 (1.702 m), weight 143 lb (64.9 kg), SpO2 98%. Body mass index is 22.4 kg/m.   Examination: General Appearance: The patient is well-developed, well-nourished, and in no distress. Neck Circumference: 40 cm Skin: Gross inspection of skin unremarkable. Head: normocephalic, no gross deformities. Eyes: no gross deformities noted. ENT: ears appear grossly normal Neurologic: Alert and oriented. No involuntary movements.    STOP BANG RISK ASSESSMENT S (snore) Have you been told that you snore?     YES   T (tired) Are you often tired, fatigued, or sleepy during the day?   YES  O (obstruction) Do you stop breathing, choke, or gasp during sleep? NO   P (pressure) Do you have or are you being treated for high blood pressure? NO   B (BMI) Is your body index greater than 35 kg/m? NO   A (age) Are you 34 years old or older? YES   N (neck) Do you have a neck circumference greater than 16 inches?   NO   G (gender) Are you a female? NO   TOTAL STOP/BANG "YES" ANSWERS 3                                                               A STOP-Bang score of 2 or less is considered low risk, and a score of 5 or more is high risk for  having either moderate or severe OSA. For people who score 3 or 4, doctors may need to perform further assessment to determine how likely they are to have OSA.         EPWORTH SLEEPINESS SCALE:  Scale:  (0)= no chance of dozing; (1)= slight chance of dozing; (2)= moderate chance of dozing; (3)= high chance of dozing  Chance  Situtation    Sitting and reading: 3    Watching TV: 3    Sitting Inactive in public: 2  As a passenger in car: 3      Lying down to rest: 3    Sitting and talking: 0    Sitting quielty after lunch: 3    In a car, stopped in traffic: 2   TOTAL SCORE:   19 out of 24    SLEEP STUDIES:  None   LABS: No results found for this or any previous visit (from the past 2160 hours).  Radiology: No results found.  No results found.  No results found.    Assessment and Plan: Patient Active Problem List   Diagnosis Date Noted   Pap smear for cervical cancer screening 07/18/2021   Anxiety 04/27/2021   Fever 03/21/2021   Acne 05/13/2019   Acute recurrent maxillary sinusitis 01/09/2019   Low vitamin B12 level 01/18/2018   Chronic cough 01/06/2018   Macrocytosis 12/25/2017   Vitamin D  deficiency 12/25/2017   Zoster with other complications 10/25/2017   1. Hypersomnia (Primary)    Patient evaluation suggests high risk of sleep disordered breathing due to frequent awakening and profound hypersomnia, epworth is 19/24. She has snoring and chewing/grinding activity which raises the risk of apnea. Will get psg for evaluation.  General Counseling: I have discussed the findings of the evaluation and examination with Addysin.  I have also discussed any further diagnostic evaluation thatmay be needed or ordered today. Genever verbalizes understanding of the findings of todays visit. We also reviewed her medications today and discussed drug interactions and side effects including but not limited excessive drowsiness and altered mental states. We also discussed  that there is always a risk not just to her but also people around her. she has been encouraged to call the office with any questions or concerns that should arise related to todays visit.  No orders of the defined types were placed in this encounter.       I have personally obtained a history, evaluated the patient, evaluated pertinent data, formulated the assessment and plan and placed orders.   This patient was seen today by Lauraine Lay, PA-C in collaboration with Dr. Elfreda Bathe.   Elfreda DELENA Bathe, MD [facility] Diplomate ABMS Pulmonary and Critical Care Medicine Sleep medicine

## 2024-06-08 ENCOUNTER — Ambulatory Visit (INDEPENDENT_AMBULATORY_CARE_PROVIDER_SITE_OTHER): Payer: Self-pay | Admitting: Internal Medicine

## 2024-06-08 VITALS — BP 148/87 | HR 78 | Resp 16 | Ht 67.0 in | Wt 143.0 lb

## 2024-06-08 DIAGNOSIS — G4710 Hypersomnia, unspecified: Secondary | ICD-10-CM

## 2024-11-05 ENCOUNTER — Ambulatory Visit
Admission: RE | Admit: 2024-11-05 | Discharge: 2024-11-05 | Disposition: A | Discharge status: Home / Self Care | Attending: Family Medicine | Admitting: Family Medicine

## 2024-11-05 VITALS — BP 151/84 | HR 90 | Temp 98.3°F | Resp 17 | Wt 141.0 lb

## 2024-11-05 DIAGNOSIS — J0140 Acute pansinusitis, unspecified: Secondary | ICD-10-CM

## 2024-11-05 LAB — POCT RAPID STREP A (OFFICE): Rapid Strep A Screen: NEGATIVE

## 2024-11-05 MED ORDER — AMOXICILLIN-POT CLAVULANATE 875-125 MG PO TABS: 1.0000 | ORAL_TABLET | Freq: Two times a day (BID) | ORAL | 0 refills | Status: AC

## 2024-11-05 MED ORDER — PREDNISONE 20 MG PO TABS: 40.0000 mg | ORAL_TABLET | Freq: Every day | ORAL | 0 refills | Status: AC

## 2024-11-05 NOTE — Discharge Instructions (Addendum)
 Your strep test is negative. Stop by the pharmacy to pick up your prescriptions.  Follow up with your primary care provider or return to the urgent care, if not improving.

## 2024-11-05 NOTE — ED Triage Notes (Signed)
 Pt c/o cough, nasal congestion, nose bleeds, sore throat and sinus pressure x 10 days. Pt has taken OTC medication for her symptoms.

## 2024-11-05 NOTE — ED Provider Notes (Signed)
 " [facility]    CSN: [id number] Arrival date & time: 11/05/24  0935      History   Chief Complaint Chief Complaint  Patient presents with   Nasal Congestion    Sinus congestion, post nasal drip, cough, sore throat, tiredness, headache has been going on since January 4. - Entered by patient   sinus pressure    Sore Throat   Headache   Cough    HPI Heather Browning is a 69 y.o. female.   HPI  History obtained from the patient. Hanh presents for nasal congestion, sinus pressure and post nasal drip for the past 11 days.  She has brown and bloody discharge from the nose. She has a sore throat and cough related to the post nasal drip.  Tried Sudafed, ibuprofen, Tylenol and Flonase without relief. Her eyes are started to itch too.     Past Medical History:  Diagnosis Date   Actinic keratosis 11/18/2023   Right Forehead   Basal cell carcinoma    multiple treated at derm in [place]    BCC (basal cell carcinoma) 11/18/2023   Right Upper Arm, EDC   Dysplastic nevus 02/19/2022   R upper inner arm, mod-severe, shave removal 04/09/2022   Dysplastic nevus 02/19/2022   R lower back, severe atypia, Excised 04/02/2022   Shingles    Skin cancer    Squamous cell carcinoma of skin 2017   face   Vitamin D  deficiency 12/25/2017    Patient Active Problem List   Diagnosis Date Noted   Hypersomnia 06/08/2024   Pap smear for cervical cancer screening 07/18/2021   Anxiety 04/27/2021   Fever 03/21/2021   Acne 05/13/2019   Acute recurrent maxillary sinusitis 01/09/2019   Low vitamin B12 level 01/18/2018   Chronic cough 01/06/2018   Macrocytosis 12/25/2017   Vitamin D  deficiency 12/25/2017   Zoster with other complications 10/25/2017    Past Surgical History:  Procedure Laterality Date   ABDOMINAL HYSTERECTOMY     pt has one ovary left   TONSILLECTOMY      OB History   No obstetric history on file.      Home Medications    Prior to Admission medications   Medication Sig Start Date End Date Taking? Authorizing Provider  amoxicillin -clavulanate (AUGMENTIN ) 875-125 MG tablet Take 1 tablet by mouth every 12 (twelve) hours. 11/05/24  Yes Makenzie Weisner, DO  predniSONE  (DELTASONE ) 20 MG tablet Take 2 tablets (40 mg total) by mouth daily for 3 days. 11/05/24 11/08/24 Yes Roya Gieselman, DO  Cholecalciferol (D3 2000 PO) Take by mouth.    [provider]  cyanocobalamin  (VITAMIN B12) 1000 MCG tablet Take 1,000 mcg by mouth daily.    [provider]  meloxicam  (MOBIC ) 15 MG tablet TAKE 1 TABLET BY MOUTH EVERY DAY 05/11/24   Evans, Brent M, DPM  Multiple Vitamin (MULTIVITAMIN) tablet Take 1 tablet by mouth daily.    [provider]  valACYclovir  (VALTREX ) 1000 MG tablet Take 1 tablet (1,000 mg total) by mouth daily. 05/08/23   Jackquline Sawyer, MD    Family History Family History  Problem Relation Age of Onset   Polycythemia Mother    Heart disease Father    Heart disease Maternal Grandmother    Heart disease Maternal Grandfather    Heart disease Paternal Grandfather     Social History Social History[1]   Allergies   Macrobid [nitrofurantoin macrocrystal] and Morphine   Review of Systems Review of Systems: negative unless otherwise stated in  HPI.      Physical Exam Triage Vital Signs ED Triage Vitals  Encounter Vitals Group     BP 11/05/24 0949 (!) 151/84     Girls Systolic BP Percentile --      Girls Diastolic BP Percentile --      Boys Systolic BP Percentile --      Boys Diastolic BP Percentile --      Pulse Rate 11/05/24 0949 90     Resp 11/05/24 0949 17     Temp 11/05/24 0949 98.3 F (36.8 C)     Temp Source 11/05/24 0949 Oral     SpO2 11/05/24 0949 97 %     Weight 11/05/24 0948 141 lb (64 kg)     Height --      Head Circumference --      Peak Flow --      Pain Score 11/05/24 0948 4     Pain Loc --      Pain Education --      Exclude from Growth Chart --    No data found.  Updated Vital  Signs BP (!) 151/84 (BP Location: Right Arm)   Pulse 90   Temp 98.3 F (36.8 C) (Oral)   Resp 17   Wt 64 kg   SpO2 97%   BMI 22.08 kg/m   Visual Acuity Right Eye Distance:   Left Eye Distance:   Bilateral Distance:    Right Eye Near:   Left Eye Near:    Bilateral Near:     Physical Exam GEN:     alert, non-toxic appearing female in no distress    HENT:  mucus membranes moist, oropharyngeal without lesions or erythema, no tonsillar hypertrophy or exudates,  moderate erythematous edematous turbinates, thick nasal discharge, ethmoid, frontal maxillary sinus tenderness bilaterally   EYES:  no scleral injection or discharge NECK:  good ROM, no meningismus   RESP:  no increased work of breathing CVS:   regular rate  Skin:   warm and dry    UC Treatments / Results  Labs (all labs ordered are listed, but only abnormal results are displayed) Labs Reviewed  POCT RAPID STREP A (OFFICE) - Normal    EKG   Radiology No results found.   Procedures Procedures (including critical care time)  Medications Ordered in UC Medications - No data to display  Initial Impression / Assessment and Plan / UC Course  I have reviewed the triage vital signs and the nursing notes.  Pertinent labs & imaging results that were available during my care of the patient were reviewed by me and considered in my medical decision making (see chart for details).      Pt is a 69 y.o. female who presents for 1-2 weeks of nasal congestion that is not improving.  Bristyl is  afebrile here. Satting well on room air. Overall pt is  non-toxic appearing, well hydrated, without respiratory distress. On exam, she has diffuse sinus tenderness with erythematous and edematous turbinates with thick nasal discharge.  COVID  and influenza testing deferred due to length of symptoms.  Treat for pansinusitis with antibiotics and steroids as below. Typical duration of symptoms discussed. Return and ED precautions given and  patient voiced understanding.   Discussed MDM, treatment plan and plan for follow-up with patient who agrees with plan.      Final Clinical Impressions(s) / UC Diagnoses   Final diagnoses:  Acute non-recurrent pansinusitis     Discharge Instructions  Your strep test is negative.  Stop by the pharmacy to pick up your prescriptions.  Follow up with your primary care provider or return to the urgent care, if not improving.       ED Prescriptions     Medication Sig Dispense Auth. Provider   amoxicillin -clavulanate (AUGMENTIN ) 875-125 MG tablet Take 1 tablet by mouth every 12 (twelve) hours. 14 tablet Justyn Langham, DO   predniSONE  (DELTASONE ) 20 MG tablet Take 2 tablets (40 mg total) by mouth daily for 3 days. 6 tablet Kriste Berth, DO      PDMP not reviewed this encounter.     [1]  Social History Tobacco Use   Smoking status: Never    Passive exposure: Never   Smokeless tobacco: Never  Vaping Use   Vaping status: Never Used  Substance Use Topics   Alcohol use: Not Currently    Comment: once a month/ wine   Drug use: No     Kriste Berth, DO 11/05/24 1457  "

## 2024-11-17 ENCOUNTER — Encounter: Payer: Self-pay | Admitting: Dermatology

## 2024-11-17 ENCOUNTER — Ambulatory Visit: Payer: Medicare Other | Admitting: Dermatology

## 2024-11-17 DIAGNOSIS — Z86018 Personal history of other benign neoplasm: Secondary | ICD-10-CM

## 2024-11-17 DIAGNOSIS — L578 Other skin changes due to chronic exposure to nonionizing radiation: Secondary | ICD-10-CM | POA: Diagnosis not present

## 2024-11-17 DIAGNOSIS — D225 Melanocytic nevi of trunk: Secondary | ICD-10-CM | POA: Diagnosis not present

## 2024-11-17 DIAGNOSIS — L814 Other melanin hyperpigmentation: Secondary | ICD-10-CM | POA: Diagnosis not present

## 2024-11-17 DIAGNOSIS — L570 Actinic keratosis: Secondary | ICD-10-CM | POA: Diagnosis not present

## 2024-11-17 DIAGNOSIS — L820 Inflamed seborrheic keratosis: Secondary | ICD-10-CM

## 2024-11-17 DIAGNOSIS — W908XXA Exposure to other nonionizing radiation, initial encounter: Secondary | ICD-10-CM | POA: Diagnosis not present

## 2024-11-17 DIAGNOSIS — D2271 Melanocytic nevi of right lower limb, including hip: Secondary | ICD-10-CM | POA: Diagnosis not present

## 2024-11-17 DIAGNOSIS — I781 Nevus, non-neoplastic: Secondary | ICD-10-CM

## 2024-11-17 DIAGNOSIS — Z85828 Personal history of other malignant neoplasm of skin: Secondary | ICD-10-CM

## 2024-11-17 DIAGNOSIS — Z1283 Encounter for screening for malignant neoplasm of skin: Secondary | ICD-10-CM

## 2024-11-17 DIAGNOSIS — L821 Other seborrheic keratosis: Secondary | ICD-10-CM

## 2024-11-17 DIAGNOSIS — D1801 Hemangioma of skin and subcutaneous tissue: Secondary | ICD-10-CM

## 2024-11-17 DIAGNOSIS — D229 Melanocytic nevi, unspecified: Secondary | ICD-10-CM

## 2024-11-17 NOTE — Progress Notes (Signed)
 "  Follow-Up Visit   Subjective  Heather Browning is a 69 y.o. female who presents for the following: Skin Cancer Screening and Full Body Skin Exam. Hx of SCC. Hx of BCC. Hx of dysplastic nevi. Hx of AKs.  PDT treatment to the face 06/2023 and chest treatment 2023.   The patient presents for Total-Body Skin Exam (TBSE) for skin cancer screening and mole check. The patient has spots, moles and lesions to be evaluated, some may be new or changing and the patient may have concern these could be cancer.   The following portions of the chart were reviewed this encounter and updated as appropriate: medications, allergies, medical history  Review of Systems:  No other skin or systemic complaints except as noted in HPI or Assessment and Plan.  Objective  Well appearing patient in no apparent distress; mood and affect are within normal limits.  A full examination was performed including scalp, head, eyes, ears, nose, lips, neck, chest, axillae, abdomen, back, buttocks, bilateral upper extremities, bilateral lower extremities, hands, feet, fingers, toes, fingernails, and toenails. All findings within normal limits unless otherwise noted below.   Relevant physical exam findings are noted in the Assessment and Plan.  R upper forehead x1, L nasal dorsum x2, L temple x1 (4) Erythematous thin papules/macules with gritty scale.  Right Upper Arm x1, R post shoulder x1 (2) Erythematous keratotic or waxy stuck-on papule.   Blanching waxy pink macule at R lower calf 1 cm (not treated today)  Assessment & Plan   SKIN CANCER SCREENING PERFORMED TODAY.  ACTINIC DAMAGE WITH PRECANCEROUS ACTINIC KERATOSES Counseling for Topical Chemotherapy Management: Patient exhibits: - Severe, confluent actinic changes with pre-cancerous actinic keratoses that is secondary to cumulative UV radiation exposure over time - Condition that is severe; chronic, not at goal. - diffuse scaly erythematous macules and papules with  underlying dyspigmentation - Discussed Prescription Field Treatment topical Chemotherapy for Severe, Chronic Confluent Actinic Changes with Pre-Cancerous Actinic Keratoses Field treatment involves treatment of an entire area of skin that has confluent Actinic Changes (Sun/ Ultraviolet light damage) and PreCancerous Actinic Keratoses by method of PhotoDynamic Therapy (PDT) and/or prescription Topical Chemotherapy agents such as 5-fluorouracil, 5-fluorouracil/calcipotriene, and/or imiquimod.  The purpose is to decrease the number of clinically evident and subclinical PreCancerous lesions to prevent progression to development of skin cancer by chemically destroying early precancer changes that may or may not be visible.  It has been shown to reduce the risk of developing skin cancer in the treated area. As a result of treatment, redness, scaling, crusting, and open sores may occur during treatment course. One or more than one of these methods may be used and may have to be used several times to control, suppress and eliminate the PreCancerous changes. Discussed treatment course, expected reaction, and possible side effects. - Recommend daily broad spectrum sunscreen SPF 30+ to sun-exposed areas, reapply every 2 hours as needed.  - Staying in the shade or wearing long sleeves, sun glasses (UVA+UVB protection) and wide brim hats (4-inch brim around the entire circumference of the hat) are also recommended. - Call for new or changing lesions. - Will schedule photodynamic therapy/red light to the face with debridement   LENTIGINES, SEBORRHEIC KERATOSES, HEMANGIOMAS - Benign normal skin lesions - Benign-appearing - Call for any changes  MELANOCYTIC NEVI - Tan-brown and/or pink-flesh-colored symmetric macules and papules - 4 x 3 mm med brown macule R lower abdomen - 5 mm light tan slightly firm papule at right ant ankle -  Benign appearing on exam today - Observation - Call clinic for new or changing  moles - Recommend daily use of broad spectrum spf 30+ sunscreen to sun-exposed areas.   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 - No evidence of recurrence today - Recommend regular full body skin exams - Recommend daily broad spectrum sunscreen SPF 30+ to sun-exposed areas, reapply every 2 hours as needed.  - Call if any new or changing lesions are noted between office visits   HISTORY OF BASAL CELL CARCINOMA OF THE SKIN Right lateral shoulder, 2000, tx in [place] . Right upper arm, [facility] 11/18/2023 - No evidence of recurrence today - Recommend regular full body skin exams - Recommend daily broad spectrum sunscreen SPF 30+ to sun-exposed areas, reapply every 2 hours as needed.  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  - Call if any new or changing lesions are noted between office visits  Spider Veins - Dilated blue, purple or red veins at the lower extremities - Reassured - Smaller vessels can be treated by sclerotherapy (a procedure to inject a medicine into the veins to make them disappear) if desired, but the treatment is not covered by insurance. Larger vessels may be covered if symptomatic and we would refer to vascular surgeon if treatment desired.  AK (ACTINIC KERATOSIS) (4) R upper forehead x1, L nasal dorsum x2, L temple x1 (4) Actinic keratoses are precancerous spots that appear secondary to cumulative UV radiation exposure/sun exposure over time. They are chronic with expected duration over 1 year. A portion of actinic keratoses will progress to squamous cell carcinoma of the skin. It is not possible to reliably predict which spots will progress to skin  cancer and so treatment is recommended to prevent development of skin cancer.  Recommend daily broad spectrum sunscreen SPF 30+ to sun-exposed areas, reapply every 2 hours as needed.  Recommend staying in the shade or wearing long sleeves, sun glasses (UVA+UVB protection) and wide brim hats (4-inch brim around the entire circumference of the hat). Call for new or changing lesions. - Destruction of lesion - R upper forehead x1, L nasal dorsum x2, L temple x1 (4)  Destruction method: cryotherapy   Informed consent: discussed and consent obtained   Lesion destroyed using liquid nitrogen: Yes   Region frozen until ice ball extended beyond lesion: Yes   Outcome: patient tolerated procedure well with no complications   Post-procedure details: wound care instructions given   Additional details:  Prior to procedure, discussed risks of blister formation, small wound, skin dyspigmentation, or rare scar following cryotherapy. Recommend Vaseline ointment to treated areas while healing.   INFLAMED SEBORRHEIC KERATOSIS (2) Right Upper Arm x1, R post shoulder x1 (2) Symptomatic, irritating, patient would like treated. - Destruction of lesion - Right Upper Arm x1, R post shoulder x1 (2)  Destruction method: cryotherapy   Informed consent: discussed and consent obtained   Lesion destroyed using liquid nitrogen: Yes   Region frozen until ice ball extended beyond lesion: Yes   Outcome: patient tolerated procedure well with no complications   Post-procedure details: wound care instructions given   Additional details:  Prior to procedure, discussed risks of  blister formation, small wound, skin dyspigmentation, or rare scar following cryotherapy. Recommend Vaseline ointment to treated areas while healing.   Return in about 1 year (around 11/17/2025) for TBSE, HxSCC, HxBCC, HxDN, HxAK, PDT/Red Light Next Available to face with debridement .  I, Jill Parcell, CMA, am acting as scribe for Rexene Rattler,  MD.    Documentation: I have reviewed the above documentation for accuracy and completeness, and I agree with the above.  Rexene Rattler, MD    "

## 2024-11-17 NOTE — Patient Instructions (Addendum)
 Cryotherapy Aftercare  Wash gently with soap and water everyday.   Apply Vaseline Jelly daily until healed.    Recommend daily broad spectrum sunscreen SPF 30+ to sun-exposed areas, reapply every 2 hours as needed. Call for new or changing lesions.  Staying in the shade or wearing long sleeves, sun glasses (UVA+UVB protection) and wide brim hats (4-inch brim around the entire circumference of the hat) are also recommended for sun protection.      Photodynamic Therapy- Blue or Red Light Therapy  Actinic keratoses are the dry, red scaly spots on the skin caused by sun damage. A portion of these spots can turn into skin cancer with time, and treating them can help prevent development of skin cancer.   Treatment of these spots requires removal of the defective skin cells. There are various ways to remove actinic keratoses, including freezing with liquid nitrogen, treatment with creams, or treatment with a blue light procedure in the office.   Photodynamic Therapy (PDT), also known as blue or red light therapy is an in office procedure used to treat actinic keratoses. It works by targeting precancerous cells. After treatment, these cells peel off and are replaced by healthy ones.   For your phototherapy appointment, you will have two appointments on the day of your treatment. The first appointment will be to apply a cream to the treatment area. You will leave this cream on for 1-2 hours depending on the area being treated. The second appointment will be to shine a blue or red light on the area for 16-20 minutes to kill off the precancer cells. It is common to experience a burning sensation during the treatment.  After your treatment, it will be important to keep the treated areas of skin out of the sun completely for 48-72 hours (2-3 days) to prevent having a reaction.   Common side effects include: - Burning or stinging, which may be severe and can last up to 24-72 hours after your  treatment - Scaling and crusting which may last up to 2 weeks - Redness, swelling and/or peeling which can last up to 4 weeks  To Care for Your Skin After PDT/Blue/Red Light Therapy: - Wash with soap, water and shampoo as normal. - If needed, you can use cold compresses (e.g. ice packs) for comfort - If okay with your primary care doctor, you may use analgesics such as acetaminophen  (tylenol ) every 4-6 hours, not to exceed recommended dose - You may apply Cerave Healing Ointment, Vaseline or Aquaphor as needed - If you have a lot of swelling you may take a Benadryl  to help with this (this may cause drowsiness), not to exceed recommended dose. This may increase the risk of falls in people over 65 and may slow reaction time while driving, so it is not recommended to take before driving or operating machinery. - Sun Precautions - Wear a wide brim hat for the next week if outside  - Wear a sunblock with zinc or titanium dioxide at least SPF 50 daily  If you have any questions or concerns, please call the office and ask to speak with a nurse.   Melanoma ABCDEs  Melanoma is the most dangerous type of skin cancer, and is the leading cause of death from skin disease.  You are more likely to develop melanoma if you: Have light-colored skin, light-colored eyes, or red or blond hair Spend a lot of time in the sun Tan regularly, either outdoors or in a tanning bed Have had blistering  sunburns, especially during childhood Have a close family member who has had a melanoma Have atypical moles or large birthmarks  Early detection of melanoma is key since treatment is typically straightforward and cure rates are extremely high if we catch it early.   The first sign of melanoma is often a change in a mole or a new dark spot.  The ABCDE system is a way of remembering the signs of melanoma.  A for asymmetry:  The two halves do not match. B for border:  The edges of the growth are irregular. C for color:   A mixture of colors are present instead of an even brown color. D for diameter:  Melanomas are usually (but not always) greater than 6mm - the size of a pencil eraser. E for evolution:  The spot keeps changing in size, shape, and color.  Please check your skin once per month between visits. You can use a small mirror in front and a large mirror behind you to keep an eye on the back side or your body.   If you see any new or changing lesions before your next follow-up, please call to schedule a visit.  Please continue daily skin protection including broad spectrum sunscreen SPF 30+ to sun-exposed areas, reapplying every 2 hours as needed when you're outdoors.   Staying in the shade or wearing long sleeves, sun glasses (UVA+UVB protection) and wide brim hats (4-inch brim around the entire circumference of the hat) are also recommended for sun protection.      Due to recent changes in healthcare laws, you may see results of your pathology and/or laboratory studies on MyChart before the doctors have had a chance to review them. We understand that in some cases there may be results that are confusing or concerning to you. Please understand that not all results are received at the same time and often the doctors may need to interpret multiple results in order to provide you with the best plan of care or course of treatment. Therefore, we ask that you please give us  2 business days to thoroughly review all your results before contacting the office for clarification. Should we see a critical lab result, you will be contacted sooner.   If You Need Anything After Your Visit  If you have any questions or concerns for your doctor, please call our main line at [phone] and press option 4 to reach your doctor's medical assistant. If no one answers, please leave a voicemail as directed and we will return your call as soon as possible. Messages left after 4 pm will be answered the following business day.    You may also send us  a message via MyChart. We typically respond to MyChart messages within 1-2 business days.  For prescription refills, please ask your pharmacy to contact our office. Our fax number is [phone].  If you have an urgent issue when the clinic is closed that cannot wait until the next business day, you can page your doctor at the number below.    Please note that while we do our best to be available for urgent issues outside of office hours, we are not available 24/7.   If you have an urgent issue and are unable to reach us , you may choose to seek medical care at your doctor's office, retail clinic, urgent care center, or emergency room.  If you have a medical emergency, please immediately call 911 or go to the emergency department.  Pager Numbers  -  Dr. Hester: [phone]  - Dr. Jackquline: [phone]  - Dr. Claudene: [phone]   - Dr. Raymund: [phone]  In the event of inclement weather, please call our main line at [phone] for an update on the status of any delays or closures.  Dermatology Medication Tips: Please keep the boxes that topical medications come in in order to help keep track of the instructions about where and how to use these. Pharmacies typically print the medication instructions only on the boxes and not directly on the medication tubes.   If your medication is too expensive, please contact our office at [phone] option 4 or send us  a message through MyChart.   We are unable to tell what your co-pay for medications will be in advance as this is different depending on your insurance coverage. However, we may be able to find a substitute medication at lower cost or fill out paperwork to get insurance to cover a needed medication.   If a prior authorization is required to get your medication covered by your insurance company, please allow us  1-2 business days to complete this process.  Drug prices often vary depending on where the  prescription is filled and some pharmacies may offer cheaper prices.  The website www.goodrx.com contains coupons for medications through different pharmacies. The prices here do not account for what the cost may be with help from insurance (it may be cheaper with your insurance), but the website can give you the price if you did not use any insurance.  - You can print the associated coupon and take it with your prescription to the pharmacy.  - You may also stop by our office during regular business hours and pick up a GoodRx coupon card.  - If you need your prescription sent electronically to a different pharmacy, notify our office through [facility] or by phone at [phone] option 4.     Si Usted Necesita Algo Despus de Su Visita  Tambin puede enviarnos un mensaje a travs de Clinical Cytogeneticist. Por lo general respondemos a los mensajes de MyChart en el transcurso de 1 a 2 das hbiles.  Para renovar recetas, por favor pida a su farmacia que se ponga en contacto con nuestra oficina. Randi lakes de fax es [place] [phone].  Si tiene un asunto urgente cuando la clnica est cerrada y que no puede esperar hasta el siguiente da hbil, puede llamar/localizar a su doctor(a) al nmero que aparece a continuacin.   Por favor, tenga en cuenta que aunque hacemos todo lo posible para estar disponibles para asuntos urgentes fuera del horario de [place], no estamos disponibles las 24 horas del da, los 7 [address]  [address] de la [place].   Si tiene un problema urgente y no puede comunicarse con nosotros, puede optar por buscar atencin mdica  en el consultorio de su doctor(a), en una clnica privada, en un centro de atencin urgente o en una sala de emergencias.  Si tiene engineer, drilling, por favor llame inmediatamente al 911 o vaya a la sala de emergencias.  Nmeros de bper  - Dr. Hester: [phone]  - Dra. Jackquline: [phone]  - Dr. Claudene: [phone]  - Dra. Kitts: [phone]  En  caso de inclemencias del [place], por favor llame a nuestra lnea principal al [phone] para una actualizacin sobre el estado de cualquier retraso o cierre.  Consejos para la medicacin en dermatologa: Por favor, guarde las cajas en las que vienen los medicamentos de uso tpico para ayudarle a seguir las instrucciones sobre dnde  y cmo usarlos. Las farmacias generalmente imprimen las instrucciones del medicamento slo en las cajas y no directamente en los tubos del [place].   Si su medicamento es muy caro, por favor, pngase en contacto con landry rieger llamando al [phone] y presione la opcin 4 o envenos un mensaje a travs de Clinical Cytogeneticist.   No podemos decirle cul ser su copago por los medicamentos por adelantado ya que esto es diferente dependiendo de la cobertura de su seguro. Sin embargo, es posible que podamos encontrar un medicamento sustituto a audiological scientist un formulario para que el seguro cubra el medicamento que se considera necesario.   Si se requiere una autorizacin previa para que su compaa de seguros cubra su medicamento, por favor permtanos de 1 a 2 das hbiles para completar este proceso.  Los precios de los medicamentos varan con frecuencia dependiendo del environmental consultant de dnde se surte la receta y alguna farmacias pueden ofrecer precios ms baratos.  El sitio web www.goodrx.com tiene cupones para medicamentos de health and safety inspector. Los precios aqu no tienen en cuenta lo que podra costar con la ayuda del seguro (puede ser ms barato con su seguro), pero el sitio web puede darle el precio si no utiliz tourist information centre manager.  - Puede imprimir el cupn correspondiente y llevarlo con su receta a la farmacia.  - Tambin puede pasar por nuestra oficina durante el horario de atencin regular y education officer, museum una tarjeta de cupones de GoodRx.  - Si necesita que su receta se enve electrnicamente a una farmacia diferente, informe a nuestra oficina a travs de MyChart de [facility] o por telfono llamando al [phone] y presione la opcin 4.

## 2025-01-05 ENCOUNTER — Encounter

## 2025-01-05 ENCOUNTER — Ambulatory Visit

## 2025-11-23 ENCOUNTER — Encounter: Admitting: Dermatology
